# Patient Record
Sex: Male | Born: 1955 | Race: White | Hispanic: No | Marital: Married | Smoking: Current every day smoker
Health system: Southern US, Community
[De-identification: ages and names within clinical notes are randomized; demographics above are authoritative.]

## PROBLEM LIST (undated history)

## (undated) DIAGNOSIS — M1990 Unspecified osteoarthritis, unspecified site: Secondary | ICD-10-CM

## (undated) DIAGNOSIS — E785 Hyperlipidemia, unspecified: Secondary | ICD-10-CM

## (undated) DIAGNOSIS — I10 Essential (primary) hypertension: Secondary | ICD-10-CM

---

## 2007-12-25 ENCOUNTER — Ambulatory Visit: Payer: Self-pay | Admitting: *Deleted

## 2007-12-25 ENCOUNTER — Inpatient Hospital Stay (HOSPITAL_COMMUNITY): Admission: AD | Admit: 2007-12-25 | Discharge: 2007-12-29 | Payer: Self-pay | Admitting: Internal Medicine

## 2008-01-11 ENCOUNTER — Ambulatory Visit: Payer: Self-pay | Admitting: Cardiology

## 2008-02-24 ENCOUNTER — Ambulatory Visit: Payer: Self-pay | Admitting: Cardiology

## 2008-02-24 LAB — CONVERTED CEMR LAB
ALT: 33 units/L (ref 0–53)
AST: 18 units/L (ref 0–37)
Alkaline Phosphatase: 56 units/L (ref 39–117)
HDL: 39.9 mg/dL (ref >39.0)
Total Bilirubin: 1.2 mg/dL (ref 0.3–1.2)
Total CHOL/HDL Ratio: 3.1
Total CK: 71 units/L (ref 7–195)
Triglycerides: 129 mg/dL (ref 0–149)

## 2008-02-28 ENCOUNTER — Ambulatory Visit: Payer: Self-pay

## 2008-03-23 ENCOUNTER — Ambulatory Visit: Payer: Self-pay | Admitting: Cardiology

## 2008-07-19 ENCOUNTER — Ambulatory Visit: Payer: Self-pay | Admitting: Cardiology

## 2008-10-19 DIAGNOSIS — E785 Hyperlipidemia, unspecified: Secondary | ICD-10-CM

## 2008-10-19 DIAGNOSIS — K219 Gastro-esophageal reflux disease without esophagitis: Secondary | ICD-10-CM

## 2008-10-19 DIAGNOSIS — M069 Rheumatoid arthritis, unspecified: Secondary | ICD-10-CM | POA: Insufficient documentation

## 2008-10-19 DIAGNOSIS — I2510 Atherosclerotic heart disease of native coronary artery without angina pectoris: Secondary | ICD-10-CM | POA: Insufficient documentation

## 2008-10-20 ENCOUNTER — Ambulatory Visit: Payer: Self-pay | Admitting: Cardiology

## 2008-10-23 ENCOUNTER — Telehealth (INDEPENDENT_AMBULATORY_CARE_PROVIDER_SITE_OTHER): Payer: Self-pay | Admitting: *Deleted

## 2008-11-01 ENCOUNTER — Telehealth: Payer: Self-pay | Admitting: Cardiology

## 2008-11-28 ENCOUNTER — Ambulatory Visit: Payer: Self-pay | Admitting: Cardiology

## 2008-12-20 ENCOUNTER — Telehealth: Payer: Self-pay | Admitting: Cardiology

## 2008-12-20 LAB — CONVERTED CEMR LAB
Alkaline Phosphatase: 56 units/L (ref 39–117)
Bilirubin, Direct: 0 mg/dL (ref 0.0–0.3)
HDL: 45.8 mg/dL (ref >39.00)
Total Bilirubin: 1 mg/dL (ref 0.3–1.2)
Total Protein: 7.1 g/dL (ref 6.0–8.3)
VLDL: 41.2 mg/dL — ABNORMAL HIGH (ref 0.0–40.0)

## 2009-03-16 ENCOUNTER — Ambulatory Visit: Payer: Self-pay | Admitting: Cardiology

## 2009-03-16 DIAGNOSIS — F17200 Nicotine dependence, unspecified, uncomplicated: Secondary | ICD-10-CM | POA: Insufficient documentation

## 2009-03-26 LAB — CONVERTED CEMR LAB
ALT: 22 units/L (ref 0–53)
AST: 15 units/L (ref 0–37)
Alkaline Phosphatase: 52 units/L (ref 39–117)
Bilirubin, Direct: 0 mg/dL (ref 0.0–0.3)
LDL Cholesterol: 35 mg/dL (ref 0–99)
Total Bilirubin: 1 mg/dL (ref 0.3–1.2)
Total CHOL/HDL Ratio: 3
Total Protein: 7 g/dL (ref 6.0–8.3)

## 2009-05-10 ENCOUNTER — Encounter: Payer: Self-pay | Admitting: Cardiology

## 2009-08-16 ENCOUNTER — Telehealth: Payer: Self-pay | Admitting: Cardiology

## 2010-01-04 ENCOUNTER — Telehealth: Payer: Self-pay | Admitting: Cardiology

## 2010-01-14 ENCOUNTER — Telehealth: Payer: Self-pay | Admitting: Cardiology

## 2010-01-18 ENCOUNTER — Telehealth: Payer: Self-pay | Admitting: Cardiology

## 2010-02-20 ENCOUNTER — Telehealth: Payer: Self-pay | Admitting: Cardiology

## 2010-05-03 ENCOUNTER — Encounter: Payer: Self-pay | Admitting: Cardiology

## 2010-05-03 ENCOUNTER — Ambulatory Visit: Payer: Self-pay | Admitting: Cardiology

## 2010-05-05 LAB — CONVERTED CEMR LAB
ALT: 22 units/L (ref 0–53)
AST: 19 units/L (ref 0–37)
Albumin: 3.9 g/dL (ref 3.5–5.2)
Cholesterol: 106 mg/dL (ref 0–200)
HDL: 31.4 mg/dL — ABNORMAL LOW (ref >39.00)
Total Bilirubin: 1.9 mg/dL — ABNORMAL HIGH (ref 0.3–1.2)
Total Protein: 7 g/dL (ref 6.0–8.3)
Triglycerides: 158 mg/dL — ABNORMAL HIGH (ref 0.0–149.0)
VLDL: 31.6 mg/dL (ref 0.0–40.0)

## 2010-05-06 ENCOUNTER — Encounter: Payer: Self-pay | Admitting: Cardiology

## 2010-06-18 NOTE — Progress Notes (Signed)
Summary: Protonix  Phone Note Call from Patient Call back at Home Phone [phone]   Caller: Spouse/Cynthia Pruett Reason for Call: Talk to Nurse, Talk to Doctor Summary of Call: per spouse insurance will not pay for protonix and she needs a generic Initial call taken by: Omer Jack,  August 16, 2009 3:28 PM  Follow-up for Phone Call        I made the pt's wife aware that Pantoprazole is the generic for Protonix.  The pt's wife said the pt is having reflux even while taking this medication.  The pt's wife is wondering if he can take this medication twice a day.  I will speak with Dr Riley Kill about increasing the pt's Pantoprazole to two times a day.  I made the pt's wife aware that smoking makes reflux worse and recommended smoking cessation.   Follow-up by: Julieta Gutting, RN, BSN,  August 16, 2009 3:55 PM  Additional Follow-up for Phone Call Additional follow up Details #1::        I spoke with Dr Riley Kill about this pt and he said the pt could take Pantoprazole 40mg  two times a day.  Dr Riley Kill would like the pt to see his PCP for further evaluation of his reflux.  The pt can have a new Rx with these instructions but Dr Riley Kill would like further refills to come from the PCP once the pt has been evaluated.   I spoke with the pt's wife and made her aware that the pt can take Pantoprazole two times a day and the pt needs further evaluation of reflux.  At this time the pt does not want to increase Pantoprazole because insurance may not pay for the medication.  The pt also does not want to seek further evaluation for his reflux.  I will send in a Rx for Pantoprazole 40mg  daily.  Additional Follow-up by: Julieta Gutting, RN, BSN,  August 16, 2009 5:21 PM    New/Updated Medications: PANTOPRAZOLE SODIUM 40 MG TBEC (PANTOPRAZOLE SODIUM) take one tablet by mouth daily Prescriptions: PANTOPRAZOLE SODIUM 40 MG TBEC (PANTOPRAZOLE SODIUM) take one tablet by mouth daily  #30 x 3   Entered by:   Julieta Gutting, RN, BSN   Authorized by:   Ronaldo Miyamoto, MD, [facility]   Signed by:   Julieta Gutting, RN, BSN on 08/16/2009   Method used:   Electronically to        [facility] [address]* (retail)       [address]       [place], [place]  [address]       Ph: [phone]       Fax: [phone]   RxID:   [id number]

## 2010-06-18 NOTE — Progress Notes (Signed)
Summary: need extraction of all teeth   Phone Note Call from Patient Call back at Home Phone [phone]   Caller: Patient Reason for Call: Talk to Nurse Summary of Call: per pt calling, pt went to dentist on yesterday all teeth need to be extraction. discuss option hospital or outpatient.  Initial call taken by: Lorne Skeens,  January 04, 2010 2:49 PM  Follow-up for Phone Call        I spoke with the pt's wife and she said the pt was having mouth pain last week and had to go to the ER for treatment.  The ER said the pt needed to have all of his teeth pulled and they started the pt on an antibiotic.  The pt did see the [facility] in [place] and they agreed with the recommendation from the ER and  told him to contact his Cardiologist to get a referral to an oral surgeon. The pt does not have a dentist.   They felt like the pt would need to be hospitalized for dental extractions.  I will ask Dr Riley Kill if he can recommend an oral surgeon.  Julieta Gutting, RN, BSN  January 07, 2010 10:13 AM  Additional Follow-up for Phone Call Additional follow up Details #1::        Pt wife calling back regarding her husband want a call back Judie Grieve  January 10, 2010 4:58 PM  Dr Riley Kill recommends that the pt contact either Dr Thea Gist in [place] [phone])  or Dr Cindra Eves in [place] [phone]).  The pt's wife will contact these oral surgeons for an appointment.  I made her aware that Dr Riley Kill will need to clear the pt prior to dental surgery.   Additional Follow-up by: Julieta Gutting, RN, BSN,  January 11, 2010 9:44 AM

## 2010-06-18 NOTE — Progress Notes (Signed)
Summary: calling re referal  Phone Note Call from Patient   Reason for Call: Talk to Nurse Summary of Call: pt wants to talk to nurse re a referral to the [employer] [facility]-has spoken w.lauren re this [phone] Initial call taken by: Glynda Jaeger,  January 18, 2010 10:49 AM  Follow-up for Phone Call        talked with pt's wife--wife states she contacted the [facility]  [facility] and they  require a treatment plan before they will accept him as a  patient--pt's wife is aware the referral for dental treatment plan should be done by [facility]--wife forwarded to medical records to have medical records sent to [facility]

## 2010-06-18 NOTE — Progress Notes (Signed)
Summary: refill denied   Phone Note Call from Patient   Caller: Patient [phone] Reason for Call: Talk to Nurse Summary of Call: pt calling re protonix-denied refill Initial call taken by: Glynda Jaeger,  February 20, 2010 3:30 PM  Follow-up for Phone Call        I spoke with the pt and made him aware that per phone note from March Dr Riley Kill wanted Protonix refilled by PCP.  The pt said his wife did not let him know this information.  The pt has been out of protonix for a few days and he does not have any pending appts with PCP.  I will send in a 30 day supply with no refills.  Follow-up by: Julieta Gutting, RN, BSN,  February 20, 2010 3:43 PM    New/Updated Medications: PANTOPRAZOLE SODIUM 40 MG TBEC (PANTOPRAZOLE SODIUM) take one tablet by mouth daily Prescriptions: PANTOPRAZOLE SODIUM 40 MG TBEC (PANTOPRAZOLE SODIUM) take one tablet by mouth daily  #30 x 0   Entered by:   Julieta Gutting, RN, BSN   Authorized by:   Ronaldo Miyamoto, MD, [facility]   Signed by:   Julieta Gutting, RN, BSN on 02/20/2010   Method used:   Electronically to        [facility] [address] (retail)       [address]       [place], [place]  [address]       Ph: [phone]       Fax: [phone]   RxID:   [id number]

## 2010-06-18 NOTE — Progress Notes (Signed)
Summary: pt needs new referral  Phone Note Call from Patient Call back at Home Phone [phone]   Caller: Spouse Reason for Call: Talk to Nurse, Talk to Doctor Summary of Call: The dentist you sent the pt to doesn't take their insurance and they need to be refered to [facility] Initial call taken by: Omer Jack,  January 14, 2010 3:18 PM  Follow-up for Phone Call        I spoke with the pt's wife and gave her the contact information for the [facility]. Patient admissions line [phone].  The pt's wife will call tomorrow.  I made her aware that the [facility] in [place] should refer him if the pt cannot do a self-referral. Follow-up by: Julieta Gutting, RN, BSN,  January 14, 2010 5:05 PM

## 2010-06-20 NOTE — Assessment & Plan Note (Signed)
Summary: f1y   Visit Type:  1 year follow up  CC:  No cardiac complaints.  History of Present Illness: Stable at present.  Denies any chest pain.  Overall is doing well.  Gave up smoking about 4 months ago.  Overall he is doing well.  RA is under control.  Discussed DAPT again in detail, he wishes to continue  (has inflammatory disorder, Charisma considered).    Problems Prior to Update: 1)  Tobacco User  (ICD-305.1) 2)  Cad  (ICD-414.00) 3)  Hyperlipidemia  (ICD-272.4) 4)  Gastroesophageal Reflux Disease  (ICD-530.81) 5)  Arthritis, Rheumatoid  (ICD-714.0)  Current Medications (verified): 1)  Plavix 75 Mg Tabs (Clopidogrel Bisulfate) .... Take One Tablet By Mouth Daily 2)  Metoprolol Tartrate 25 Mg Tabs (Metoprolol Tartrate) .... Take 1/2 Tablet Two Times A Day 3)  Methotrexate 2.5 Mg Tabs (Methotrexate Sodium) .... Take 10 Tablets Once A Week 4)  Humira 40 Mg/0.86ml Kit (Adalimumab) .... One Shot Once A Week 5)  Folic Acid 1 Mg Tabs (Folic Acid) .... Take 1 Tablet By Mouth Once A Day 6)  Prednisone 10 Mg Tabs (Prednisone) .... Take 1 Tablet By Mouth Once A Day 7)  Pantoprazole Sodium 40 Mg Tbec (Pantoprazole Sodium) .... Take One Tablet By Mouth Daily 8)  Aspirin 81 Mg Tbec (Aspirin) .... Take One Tablet By Mouth Daily 9)  Nitroglycerin 0.4 Mg Subl (Nitroglycerin) .... One Tablet Under Tongue Every 5 Minutes As Needed For Chest Pain---May Repeat Times Three 10)  Lortab 5-500 Mg Tabs (Hydrocodone-Acetaminophen) .... As Needed 11)  Crestor 20 Mg Tabs (Rosuvastatin Calcium) .... Take One-Half  Tablet By Mouth Daily. 12)  Calcium Carbonate-Vitamin D 600-400 Mg-Unit  Tabs (Calcium Carbonate-Vitamin D) .... Take 1 Tablet By Mouth Once A Day  Allergies (verified): No Known Drug Allergies  Vital Signs:  Patient profile:   55 year old male Height:      69 inches Weight:      203.50 pounds BMI:     30.16 Pulse rate:   62 / minute Pulse rhythm:   regular Resp:     18 per minute BP  sitting:   120 / 78  (left arm) Cuff size:   large  Vitals Entered By: Vikki Ports (May 03, 2010 11:35 AM)  Physical Exam  General:  Well developed, well nourished, in no acute distress. Head:  normocephalic and atraumatic Eyes:  PERRLA/EOM intact; conjunctiva and lids normal. Lungs:  Clear bilaterally to auscultation and percussion. Heart:  PMI non displaced. Normal S1 and S2.  No definite murmur or rub Pulses:  pulses normal in all 4 extremities Extremities:  No clubbing or cyanosis. Neurologic:  Alert and oriented x 3.   EKG  Procedure date:  05/03/2010  Findings:      NSR.  Within normal limits.   Impression & Recommendations:  Problem # 1:  CAD (ICD-414.00) appears stopping.  I offered hiim the option of stopping DAPT, he prefers to continue.  Reasons assessed. His updated medication list for this problem includes:    Plavix 75 Mg Tabs (Clopidogrel bisulfate) .Marland Kitchen... Take one tablet by mouth daily    Metoprolol Tartrate 25 Mg Tabs (Metoprolol tartrate) .Marland Kitchen... Take 1/2 tablet two times a day    Aspirin 81 Mg Tbec (Aspirin) .Marland Kitchen... Take one tablet by mouth daily    Nitroglycerin 0.4 Mg Subl (Nitroglycerin) ..... One tablet under tongue every 5 minutes as needed for chest pain---may repeat times three  Orders: EKG w/ Interpretation (93000)  TLB-Lipid Panel (80061-LIPID) TLB-Hepatic/Liver Function Pnl (80076-HEPATIC)  Problem # 2:  HYPERLIPIDEMIA (ICD-272.4) need to check lipid and liver. Goals expressed. His updated medication list for this problem includes:    Crestor 20 Mg Tabs (Rosuvastatin calcium) .Marland Kitchen... Take one-half  tablet by mouth daily.  Orders: TLB-Lipid Panel (80061-LIPID) TLB-Hepatic/Liver Function Pnl (80076-HEPATIC)  Problem # 3:  TOBACCO USER (ICD-305.1) stopped.  Problem # 4:  ARTHRITIS, RHEUMATOID (ICD-714.0) improved.  Patient Instructions: 1)  Your physician wants you to follow-up in: 1 YEAR.  You will receive a reminder letter in the mail  two months in advance. If you don't receive a letter, please call our office to schedule the follow-up appointment. 2)  Your physician recommends that you continue on your current medications as directed. Please refer to the Current Medication list given to you today. 3)  Your physician recommends that you have a FASTING LIPID and LIVER Profile today.

## 2010-06-20 NOTE — Letter (Signed)
Summary: Custom - Lipid  [facility], Main Office  [address]   [place], [place] [address]   Phone: [phone]  Fax: [phone]     May 06, 2010 MRN: [id number]   [facility] [address] [place], [place]  [address]   Dear Mr. Matassa,  We have reviewed your cholesterol results.  They are as follows:     Total Cholesterol:    106 (Desirable: less than 200)       HDL  Cholesterol:     31.40  (Desirable: greater than 40 for men and 50 for women)       LDL Cholesterol:       43  (Desirable: less than 100 for low risk and less than 70 for moderate to high risk)       Triglycerides:       158.0  (Desirable: less than 150)  Our recommendations include: Let patient know his cholesterol looks great.  He can continue the same treatment.  T[place]   Call our office at the number listed above if you have any questions.  Lowering your LDL cholesterol is important, but it is only one of a large number of "risk factors" that may indicate that you are at risk for heart disease, stroke or other complications of hardening of the arteries.  Other risk factors include:   A.  Cigarette Smoking* B.  High Blood Pressure* C.  Obesity* D.   Low HDL Cholesterol (see yours above)* E.   Diabetes Mellitus (higher risk if your is uncontrolled) F.  Family history of premature heart disease G.  Previous history of stroke or cardiovascular disease    *These are risk factors YOU HAVE CONTROL OVER.  For more information, visit .  There is now evidence that lowering the TOTAL CHOLESTEROL AND LDL CHOLESTEROL can reduce the risk of heart disease.  The American Heart Association recommends the following guidelines for the treatment of elevated cholesterol:  1.  If there is now current heart disease and less than two risk factors, TOTAL CHOLESTEROL should be less than 200 and LDL CHOLESTEROL should be less than 100. 2.  If there is current heart disease or two or more risk  factors, TOTAL CHOLESTEROL should be less than 200 and LDL CHOLESTEROL should be less than 70.  A diet low in cholesterol, saturated fat, and calories is the cornerstone of treatment for elevated cholesterol.  Cessation of smoking and exercise are also important in the management of elevated cholesterol and preventing vascular disease.  Studies have shown that 30 to 60 minutes of physical activity most days can help lower blood pressure, lower cholesterol, and keep your weight at a healthy level.  Drug therapy is used when cholesterol levels do not respond to therapeutic lifestyle changes (smoking cessation, diet, and exercise) and remains unacceptably high.  If medication is started, it is important to have you levels checked periodically to evaluate the need for further treatment options.  Thank you,    [employer] Team

## 2010-08-27 ENCOUNTER — Telehealth: Payer: Self-pay | Admitting: Cardiology

## 2010-08-27 NOTE — Telephone Encounter (Signed)
Patient had everolimus eluting DES placed in August 2009.  He has been stable at the present.  He could hold plavix for five days prior to dental extraction without difficulty.  He should remain on ASA throughout per guidelines.  TS

## 2010-08-28 NOTE — Telephone Encounter (Signed)
Left message for pt to call back  °

## 2010-08-28 NOTE — Telephone Encounter (Signed)
I spoke with the pt's wife and made her aware of Dr Rosalyn Charters recommendation. She would like note faxed to [phone] Attn: [facility], Dr Abby Potash. Pt has a pending appointment on 09/02/10.  This telephone call will be faxed.

## 2010-08-30 ENCOUNTER — Telehealth: Payer: Self-pay | Admitting: Cardiology

## 2010-08-30 NOTE — Telephone Encounter (Signed)
Pt having pain in his teeth. Pt wife wants to know if he can get some meds.

## 2010-08-30 NOTE — Telephone Encounter (Signed)
I spoke with the pt's wife and made her aware that Dr Riley Kill does not prescribe pain medications.  She was also wondering if the pt could get an antibiotic for his tooth pain.  I made her aware that she should contact the dentist who is going to be pulling teeth on Monday for antibiotic if needed.  She said that she had already contacted them and they told her to call the pt's medical doctor to be evaluated if an antibiotic is needed.  Per the pt's wife the pt has not seen PCP in a year.  I instructed her that the pt can take Tylenol for pain.  I made her aware that when the pt goes into dental office on Monday they may give the pt an antibiotic after dental extraction.

## 2010-09-12 ENCOUNTER — Other Ambulatory Visit: Payer: Self-pay | Admitting: Cardiology

## 2010-10-01 NOTE — Discharge Summary (Signed)
Patrick Archer, Patrick Archer NO.:  [id number]   MEDICAL RECORD NO.:  [id number]           PATIENT TYPE:   LOCATION:                               FACILITY:  [facility]   PHYSICIAN:  [name]. Riley Kill, MD, FACCDATE OF BIRTH:  Jul 31, 1955   DATE OF ADMISSION:  12/25/2007  DATE OF DISCHARGE:  12/30/2007                         DISCHARGE SUMMARY - REFERRING   ADMITTING PHYSICIAN:  Bevelyn Buckles. Bensimhon, MD.   DISCHARGING PHYSICIAN:  [name]. Riley Kill, MD, [facility].   DISCHARGE DIAGNOSIS:  1. ST elevated myocardial infarction.  2. Coronary artery disease.  3. Status post drug-eluting stent to the LAD (left anterior      descending).  4. Tobacco use.  5. Hyperlipidemia.  6. History of rheumatoid arthritis.  7. History as listed previously below.   PROCEDURES PERFORMED:  Cardiac catheterization with drug-eluting stent  placement to the proximal LAD by Dr. Bonnee Quin on December 27, 2007.   SUMMARY OF HISTORY:  [name] is a 55 year old white male who was  transferred from [facility] with chest discomfort.  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  Denied shortness of breath.  He took his friend's sublingual  nitroglycerin and two Darvocet without relief thus went to the emergency  room.  At [facility] he had 1 mm ST elevation inferiorly with  reciprocal changes.  By the time he was transferred to [facility] ST-  segment elevation had resolved.  He had been treated with aspirin,  heparin and nitroglycerin drip.   PAST MEDICAL HISTORY:  Is notable for significant rheumatoid arthritis  treated with methotrexate, Humira and prednisone taper as well as a  history of hypertriglyceridemia.  He also continues to smoke.   LABORATORY:  At [facility] admission weight was 93.8 kg.  H&H  on the 9th was 13.0 and 37.9, normal indices, platelets 247,  WBCs 9.5,  PTT 37, PT 13.2.  Sodium 140, potassium 4.2, BUN 10, creatinine 0.82,  glucose 89, normal LFTs except for AST that was slightly elevated at 44.  Initial CK-MB at [facility] was 110 and 8.4 with a relative index of 7.6  and troponin of 1.10.  On the 9th CK total was 309, MB 35.5, relative  index 11.5, troponin 5.78.  Subsequent CK-MBs were declining.  Fasting  lipids on the 9th showed total cholesterol 156, triglycerides 231, HDL  26, LDL 84.  TSH 1.43.  Prior to discharge on the 12th H&H was 13.1 and  37.9, normal indices, platelets 255, WBCs 9.1.   HOSPITAL COURSE:  The patient was admitted to [facility] and  was seen by Dr. Gala Romney.  It was felt that if he had recurring chest  discomfort he should go immediately to the lab.  Dr. Riley Kill reviewed  cardiac catheterization with the patient and agreed to proceed.  Catheterization performed on December 27, 2007, by Dr. Riley Kill revealed  30% proximal and 30% mid RCA lesion, 50% mid circumflex, 70% OM, 75%  with  plaque rupture in the proximal LAD.  Dr. Riley Kill performed IVUS and  utilized a drug-eluting stent to the proximal LAD reducing this to 0%.  Progressive nursing assisted with discharge needs.  Tobacco consultation  was performed.  Cardiac rehab assisted with education and ambulation.  By the 12th he was ambulating without difficulty.  Catheterization site  was intact.  He was complaining of some discomfort to his right shoulder  reminiscent of his rheumatoid arthritis.   DISPOSITION:  The patient was discharged home and asked to maintain low-  sodium heart-healthy diet.  Wound care as per supplemental sheet.  He  was advised no lifting, driving, sexual activity or heavy exertion for  at least 2 weeks.   NEW MEDICATIONS:  1. Include aspirin 325 mg daily.  2. Plavix 75 mg daily.  3. Nitroglycerin 0.4 as needed.  4. Lopressor 25 mg half tablet b.i.d.  5. Zocor 40 mg daily at bedtime.  6. He was asked to continue  his methotrexate 2.5 mg 10 tablets on      Wednesdays.  7. Humira 40 mg shot on Thursdays.  8. Prednisone taper as previously/as needed.  9. Darvocet N 100 q.4-6 hours as needed.   Prior to being discharged his medications were reviewed with the  pharmacy and it was felt that there was no significant reaction.  He  will need blood work in 6-8 weeks in regards to FLP and LFTs.  He needs  to obtain a primary care physician in [place].  He was advised no  smoking or tobacco products and to bring all medications to all followup  appointments.  He will see Dr. Riley Kill in [place] on January 11, 2008,  at 10:15.  He was asked to follow up with his rheumatologist to maintain  routine CBCs given his medications.  Discharge time 50 minutes.      [name], PA-C      [name]. Riley Kill, MD, [facility]  Electronically Signed    EW/MEDQ  D:  12/29/2007  T:  12/29/2007  Job:  [id number]   cc:   Concepcion Living

## 2010-10-01 NOTE — Assessment & Plan Note (Signed)
[facility]                            CARDIOLOGY OFFICE NOTE   Valor, Quaintance HARISH BRAM                    MRN:          [id number]  DATE:03/23/2008                            DOB:          1956/03/04    Mr. Sorber is in for followup.  He is still having a little bit of  chest discomfort, but it is clearly a musculoskeletal type of discomfort  up under the left underarm.  He really wonders whether it could be from  the Zocor.  He decreased his therapy on January 04, 2008, to 20 mg daily,  and he thinks the symptoms are somewhat improved.  As a result, he still  thinks it is musculoskeletal and is pretty convinced that it is not  cardiac.  Moreover, he relates it to the use of the statins.  He did  think that his stress test created a little bit of flare-up of his  arthritis.  The patient exercised for 5 minutes on the Bruce protocol  and did have a hypertensive response to exercise.  He says this is  distinctly unusual and different for what he usually has.  He had normal  contractility with an ejection fraction of 70% and there were no  significant changes of ischemia.  There were some inferior thinning, but  no evidence of perfusion deficit.  Other than his one symptom that has  not been changed.   MEDICATIONS:  1. Plavix 75 mg daily.  2. Lopressor 25 mg one-half tablet b.i.d.  3. Zocor 20.  4. Methotrexate.  5. Humira.  6. Folic acid 1 mg daily.  7. Prednisone 10 daily.  8. Nexium 40 mg daily.  9. Aspirin 325 mg daily.   On physical examination, he is alert and oriented in no acute distress.  Blood pressure 120/80.  The lung fields are clear.  The cardiac rhythm  is regular.  The extremities do not reveal significant edema.   IMPRESSION:  1. Atypical chest pain, etiology undetermined, negative chest x-ray      musculoskeletal type.  2. Status post implantation of drug-eluting stent, left anterior      descending artery on January 01, 2008.  3. Rheumatoid arthritis, followed very nicely by Dr. Concepcion Living      from [facility] up in the [facility].   PLAN:  1. Continue current medical regimen except for discontinuation of      Zocor and Zocor holiday of approximately 6-8 weeks to see if his      symptoms improve.  2. We will see him back in followup at that time and reassess his      overall situation.  At 6      months, we will recommend decreasing his aspirin to 81 mg, as he is      currently on steroid therapy.     [name]. Riley Kill, MD, [facility]  Electronically Signed    TDS/MedQ  DD: 03/24/2008  DT: 03/24/2008  Job #: [id number]

## 2010-10-01 NOTE — Assessment & Plan Note (Signed)
[facility]ALTHCARE                            CARDIOLOGY OFFICE NOTE   Patrick Archer, Patrick Archer                    MRN:          [id number]  DATE:07/19/2008                            DOB:          04-21-1956    Patrick Archer is in for followup.  I last saw him in November.  He has  rheumatoid arthritis followed by Dr. Elijah Birk at [facility], and is status  post implantation of a drug-eluting stent in the LAD in August 2009.  He  still has a slight degree of chest discomfort, but it is decreasing in  frequency.  He has a fair amount of tiredness.  He has remained on a  combination of prednisone and methotrexate, and we have kept him on both  aspirin and Plavix with dual antiplatelet therapy.  Unfortunately, he  does continue to smoke.   His medications include:  1. Plavix 75 mg daily.  2. Lopressor 25 mg one-half daily.  3. Zocor 20 mg daily.  4. Methotrexate 2.5 mg every Wednesday.  5. Humira.  6. Folic acid 1 mg daily.  7. Prednisone 10 mg daily.  8. Nexium 40 mg daily.  9. Aspirin 81 mg daily.   On physical examination, he is alert and oriented in no distress.  The  blood pressure is 128/83, the pulse is 69.  The lung fields are clear.  There is an S4 gallop.   The electrocardiogram demonstrates normal sinus rhythm and is  essentially a normal EKG.   IMPRESSION:  1. Coronary artery disease status post stenting of the left anterior      descending artery with drug-eluting stent on continued dual      antiplatelet therapy and negative myocardial perfusion study on      February 28, 2008.  2. Rheumatoid arthritis.  3. Continued smoking.  4. Gastroesophageal reflux on Nexium, consider switch if he can afford      it.   PLAN:  1. Return to clinic in 6 months.  2. Continue current medical regimen.   ADDENDUM  We advised him to stop smoking.      [name]. Riley Kill, MD, [facility]  Electronically Signed    TDS/MedQ  DD: 07/30/2008  DT: 07/31/2008  Job  #: [id number]   cc:   Concepcion Living

## 2010-10-01 NOTE — Assessment & Plan Note (Signed)
[facility]                            CARDIOLOGY OFFICE NOTE   Patrick Archer, Patrick Archer                    MRN:          [id number]  DATE:02/24/2008                            DOB:          1955/07/25    HISTORY OF PRESENT ILLNESS:  Patrick Archer is in for a followup.  Generally he is stable, however, he has a somewhat constant chest  discomfort that is over in the left axillary area almost in a single  distribution.  It is not the heaviness that he felt previously, or the  central chest discomfort.  He does have some fatigue.  He has gone back  to smoking about a pack of cigarettes a day.  The discomfort is somewhat  constant, not associated with diaphoresis or nausea, and completely  dissimilar to what he has had in the past.   MEDICATIONS:  1. Aspirin 325 mg daily.  2. Nexium 40 mg daily.  3. Prednisone 10 mg daily.  4. Folic acid one daily.  5. Humira 40 mg shot Thursdays.  6. Methotrexate 2.5 Wednesdays.  7. Zocor 40 mg nightly decreased to 20 mg due to muscle aches.  8. Lopressor 25 mg one-half tablet b.i.d.  9. He is also on Plavix 75 mg daily.   PHYSICAL EXAMINATION:  Today he is alert and oriented, in no distress.  Weighs 208 pounds, blood pressure 126/86, pulse 71.  There is minimal if  any tenderness around the left rib margin.  The lung fields are clear.  The cardiac rhythm is regular.   His electrocardiogram is entirely within normal limits.   Recent reading on his chest x-ray revealed submaximal inspiration, but  no active cardiopulmonary disease.   Recent BUN was 9, creatinine 0.7, white count of 65,00, hemoglobin and  hematocrit were normal.  PSA was normal as well.   The symptoms do not seem typical cardiac-type symptoms.  I think it is  reasonable to do a stress test.  He does need to stop smoking and we  talked about this in some detail.  Assuming that his stress test will be  normal, some consideration may need to be given  to doing further studies  either such as a bone scan and/or MRI of the thoracic spine, one  consideration might be a thoracic disk.  There is very little to suggest  aortic dissection.  His blood pressures were  uncontrolled.  This does not sound like pulmonary embolus and certainly  does not sound like ischemia.  We will get a lipid profile today.  We  will add CPK to his lipid profile as well.     Patrick Archer. Riley Kill, MD, [facility]  Electronically Signed    TDS/MedQ  DD: 02/24/2008  DT: 02/25/2008  Job #: [id number]   cc:   Concepcion Living

## 2010-10-01 NOTE — Assessment & Plan Note (Signed)
[facility]                            CARDIOLOGY OFFICE NOTE   Trae, Bovenzi KORIE BRABSON                    MRN:          [id number]  DATE:01/11/2008                            DOB:          May 17, 1956    Mr. Melhorn is in for a followup visit.  He in general, he is stable.  He presented to the emergency room and was transferred to [facility] with acute coronary syndrome.  Enzymes were positive.  He  subsequently underwent cardiac catheterization, which demonstrated a  ruptured plaque in the left anterior descending artery.  This was  successfully stented by IVUS-guided means.  He was subsequently  stabilized on a medical regimen.  He had some other scattered disease as  noted.  He is a smoker, since discharged from the hospital, he has  stopped smoking.  The patient also has rheumatoid arthritis, and has  been followed closely by Dr. Concepcion Living from [facility] at the clinic in  [place].  Since discharged from the hospital, the patient has felt  unusual sensation around the left rib cage, but nothing unusual.  It is  nothing like what he had when he originally presented.  His hemoglobin  at the time of admission was near normal and a platelet count was normal  as well.   He did feel some muscle aches on Zocor and subsequently that dose had  been dropped 20 mg with ultimate resolution in his symptoms.   Medications currently include,  1. Aspirin 325 mg daily.  2. Plavix 75 mg daily.  3. Lopressor 25 mg one-half b.i.d.  4. Zocor 420 mg daily.  5. Methotrexate daily.  6. Humira 40 mg shot on Thursday.  7. Folic acid 1 mg daily.  8. Prednisone 10 mg daily.  9. Nexium 40 mg.   Of note, the patient's wife does say that he experiments with his  prednisone based on how he feels.   On physical today, the blood pressure is 120/70, the pulse is 72.  The  lung fields are clear.  No murmurs, rubs, or gallops noted.  Extremities  are without  edema.   EKG today reveals normal sinus rhythm essentially within normal limits.   At the present time, the patient is stable.  He has started driving even  though he was instructed not to drive for minimum of 2 weeks.  I have  told him to gradually increase his activity at the present time.  We  will need to continue on current medical regimen and I will cut back his  aspirin to 81 mg at approximately 3 months.  If he is doing well, he  will continue on the same regimen.  We have talked about many of the issues in some detail.  I have  encouraged him not to smoke.  Should he have any problems in the  interim, he will call me back.     [name]. Riley Kill, MD, [facility]  Electronically Signed    TDS/MedQ  DD: 01/11/2008  DT: 01/12/2008  Job #: [id number]

## 2010-10-01 NOTE — Cardiovascular Report (Signed)
Patrick Archer, HECKMANN NO.:  [id number]   MEDICAL RECORD NO.:  [id number]          PATIENT TYPE:  INP   LOCATION:  2009                         FACILITY:  [facility]   PHYSICIAN:  [name]. Riley Kill, MD, FACCDATE OF BIRTH:  Jan 08, 1956   DATE OF PROCEDURE:  12/27/2007  DATE OF DISCHARGE:  12/29/2007                            CARDIAC CATHETERIZATION   INDICATIONS:  Patrick Archer is a 55 year old with rheumatoid arthritis who  presented to the emergency room with chest pain and some borderline  electrocardiographic abnormalities.  He was treated appropriately and  aggressively.  He was transferred to [facility] where  he was subsequently pain free.  His enzymes were subsequently positive.  He was stabilized on a medical regimen, and then subsequently brought to  the catheterization laboratory for further evaluation.   PROCEDURES:  1. Left heart catheterization.  2. Selective coronary arteriography.  3. Selective left ventriculography.  4. Intravascular ultrasound.  5. Percutaneous stenting of the left anterior descending artery using      a drug-eluting platform.   DESCRIPTION OF PROCEDURE:  The patient was brought to the  catheterization laboratory, and prepped and draped in the usual fashion.  Through an anterior puncture, the femoral artery was entered, a 6-French  sheath was then placed.  Views of left and right coronary arteries were  obtained in multiple angiographic projections.  Central aortic and left  ventricular pressures were measured with pigtail catheter.  Ventriculography was then performed in the RAO projection.  At this  point, we carefully reviewed the films.  The patient had a wrap-around  LAD with the distal LAD appearing to provide for the distal inferior  wall.  There appeared to be a disrupted plaque in the LAD, particularly  as noted on the LAO cranial views.  There was haziness and hypodensity.  There was bifurcational disease  of the circumflex marginal, and also  some mild irregularity of the right coronary artery, but none of these  were appeared to be high grade.  As a result, we elected to perform  intravascular ultrasound.  The patient had been on heparin and  eptifibatide, and was given a double bolus of eptifibatide.  Appropriate  phenoperidine and aspirin administration were delivered.  Following  this, an intravascular ultrasound device was passed down the left  anterior descending artery.  Views of this were obtained, and this was  subsequently followed by additional intracoronary nitroglycerin.  The  intracoronary nitroglycerin did help define the area of disease somewhat  better.  By intravascular ultrasound, there appeared to be moderate  disease proximal to the diagonal branch, and what appeared to be a large  amount of soft plaque in the LAD with a residual lumen of just over 2 x  2.  As a result, we elected to place a drug-eluting stent with careful  views of locations, a 3-0 x 28 Promus drug-eluting platform was then  placed down the left anterior descending artery and deployed at 13  atmospheres.  A post dilatation was then done with a 3.5 x 20 Voyager [place]  balloon with dilatations up  to 14 atmospheres.  There was a marked  improvement in the appearance of the artery with very slight dimpling at  the distal end of the stent, which appeared to be markedly improved  after intracoronary nitroglycerin was again administered.  The procedure  appeared to be successful.  Following this, all catheters were removed  and the femoral sheath was sewn into place.  There were no major  complications.   HEMODYNAMIC DATA:  Central aortic pressure was 115/65.  Left ventricular  pressure 115/12.  There is no significant gradient on pullback across  the aortic valve.   ANGIOGRAPHIC DATA:  1. The left main was free of critical disease.  2. The left anterior descending artery coursed to the apex.  There was       haziness particularly noted in the LAO cranial view and in the RAO      cranial views in the mid left anterior descending artery.  In the      LAO, the stenosis appeared to be about 75% luminal reduction.      Distal to this location after intracoronary nitroglycerin, there is      about 50% narrowing up to the takeoff of the diagonal.  Following      stenting, this was reduced to 0% with a residual angiographic      excellent appearance.  The distal LAD was without critical      narrowing.  3. The circumflex provides a first marginal and an AV circumflex.      There is possibly up to 70% narrowing in the marginal, although it      appears to be ostial disease involving the bifurcation with perhaps      40-50% narrowing in the AV circumflex itself.  4. The right coronary artery has approximately 30% narrowing at the      proximal mid junction, and about 30% narrowing in the midvessel.  5. Ventriculography in the RAO projection reveals overall preserved      global systolic function with an ejection fraction of probably at      least 50%.  In careful review, there appears to be some      anteroapical hypokinesis that correspond to the angiographic      findings.   Intravascular ultrasound was performed.  The catheter was taken up just  proximal to the diagonal branch.  With an automated pullback, the lumen  appears to be approximately 3 to 3.4 mm with soft plaque noted,  particularly just distal to the septal perforator.  At this location,  the lumen narrows down to approximately 2 x 2.4, and that corresponds  with the haziness noted on angiographic analysis.  More proximally the  vessel opens up and is approximately 4 mm x 4 mm lumen.  No post stent  intravascular ultrasound was performed.   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   DISPOSITION:  The patient does have rheumatoid arthritis.  Aspirin  and  Plavix for a minimum of year will be recommended with discontinuation of  Plavix after 1 year if the patient remains perfectly stable.  This will  be at the discretion of the consulting cardiologist.       [name]. Riley Kill, MD, [facility]  Electronically Signed     TDS/MEDQ  D:  01/01/2008  T:  01/01/2008  Job:  [id number]   cc:   Bevelyn Buckles. Bensimhon, MD  Concepcion Living

## 2010-10-01 NOTE — H&P (Signed)
NAMESESAR, MADEWELL NO.:  [id number]   MEDICAL RECORD NO.:  [id number]          PATIENT TYPE:  INP   LOCATION:  2903                         FACILITY:  [facility]   PHYSICIAN:  Unice Cobble, MD     DATE OF BIRTH:  1956/05/10   DATE OF ADMISSION:  12/25/2007  DATE OF DISCHARGE:                              HISTORY & PHYSICAL   CHIEF COMPLAINT:  Chest pain.   PRIMARY DOCTOR:  Dr. Concepcion Living   HISTORY OF PRESENT ILLNESS:  This is a 55 year old white male with a  history of rheumatoid arthritis and tobacco abuse who presents with  chest pain.  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  He also had diaphoresis and nausea, but no  shortness of breath or presyncope.  He took his friend's sublingual  nitroglycerin and two Darvocet without relief and went to the ER  thereafter.  At [facility] emergency department, the patient had 1 mm of  ST elevation inferiorly without reciprocal changes.  On transfer to  [facility], his ECG showed resolution of the ST elevation and the  patient was chest pain free on arrival.  Up until that time, he had been  treated with aspirin, heparin and a nitroglycerin drip.  He has no  symptoms of congestive heart failure.  His troponin at [facility] is  positive at 0.06 with a negative CK-MB.  His transfer was expedited by a  large amount of ectopy and short runs of VT up to four beats.   PAST MEDICAL HISTORY:  1. Rheumatoid arthritis.  2. Hypertriglyceridemia.   ALLERGIES:  NO KNOWN DRUG ALLERGIES.   MEDICATIONS:  1. Methotrexate 2.5 mg - 10 tablets weekly on Wednesday.  2. Humira 40 mg subcu weekly on Thursday.  3. Prednisone 5-30 mg p.r.n. for rheumatoid arthritis flares.  4. Darvocet N-100 q.4-6 h p.r.n. rheumatoid arthritis pain.   SOCIAL HISTORY:  Lives in [place], [place] with his wife.  He is  disabled from his  rheumatoid arthritis.  He smokes one pack per day and  has done so for 37 years.  No alcohol or drugs.   FAMILY HISTORY:  His mother had rheumatoid arthritis, but there is no  history of heart attack or stroke in the family.   REVIEW OF SYSTEMS:  A complete review of systems was performed and found  to be negative other than mentioned in the HPI.   PHYSICAL EXAMINATION:  VITAL SIGNS:  Afebrile with a pulse of 72, blood  pressure 123/70, respiratory rate is 14, O2 sats are 99% on 2 liters.  GENERAL:  He is in no acute distress and slightly overweight.  HEENT: Shows Viola/AT, PERRLA, EOMI, MMM.  Oropharynx without erythema or  exudates.  NECK:  Supple without lymphadenopathy, thyromegaly, bruits or jugular  venous distention.  HEART:  Regular rate and rhythm with a normal S1-S2.  No murmurs,  gallops or rubs noted.  Normal PMI.  Pulses 2+ and equal bilaterally  without bruits.  LUNGS:  Clear to auscultation bilaterally without wheezes, rhonchi or  rales.  No rashes or lesions.  ABDOMEN:  Soft and nontender with normal bowel sounds and no rebound or  guarding.  Negative hepatosplenomegaly.  EXTREMITIES:  Show no cyanosis, clubbing or edema.  MUSCULOSKELETAL:  Shows no joint deformity effusions or spine or CVA  tenderness.  NEUROLOGICAL:  He is alert and oriented x3 with cranial nerves II-XII  grossly intact.  Strength is 5/5 all extremities and axial groups with  normal sensation throughout.   STUDIES:  EKG the rate is 71 with normal sinus rhythm.  There is no ST  elevation inferiorly.  There are T-wave inversions and one and AVL, but  AVR is upright, and I suspect limb lead reversal.   White count is 11.6 with a hemoglobin of 14.9 and platelet count of 282.  Creatinine is 0.9 with a glucose of 122.  Troponin is 0.06 with a CK-MB  of 1.0.   ASSESSMENT/PLAN:  This is a 55 year old white male with an acute  myocardial infarction.  The initial ST elevation resolved upon arrival  and  his troponins are positive.  It is possible that the ectopy and  ventricular tachycardia he had at the other hospital were related to  reperfusion.  I suspect limb lead reversal is responsible for the  lateral T-wave inversions on the present EKG.  Have discussed the case  with Dr. Riley Kill and will plan on a scheduled invasive management on  Monday unless his chest pain returns or ECG changes worsen.  1. Beta blocker and nitroglycerin for blood pressure control and pain      relief.  2. Aspirin, heparin, and Integrilin for anticoagulation.  3. Statin, lipid panel pending.  4. GI prophylaxis.      Unice Cobble, MD  Electronically Signed     ACJ/MEDQ  D:  12/25/2007  T:  12/26/2007  Job:  [id number]

## 2011-01-10 ENCOUNTER — Telehealth: Payer: Self-pay | Admitting: Cardiology

## 2011-01-10 MED ORDER — NITROGLYCERIN 0.4 MG SL SUBL: 0.4000 mg | SUBLINGUAL_TABLET | SUBLINGUAL | Status: AC | PRN

## 2011-01-10 NOTE — Telephone Encounter (Addendum)
Refill of nitro to [facility] [id number], pt out wife said she called it in 3 weeks ago, I don't see any record on it

## 2011-01-11 ENCOUNTER — Other Ambulatory Visit: Payer: Self-pay | Admitting: Cardiology

## 2011-01-27 ENCOUNTER — Other Ambulatory Visit: Payer: Self-pay | Admitting: Cardiology

## 2011-02-14 LAB — BASIC METABOLIC PANEL
CO2: 26
CO2: 27
Chloride: 105
Chloride: 108
Creatinine, Ser: 0.68
Creatinine, Ser: 0.72
GFR calc Af Amer: >60
GFR calc Af Amer: >60
Potassium: 4.2
Potassium: 4.5

## 2011-02-14 LAB — CBC
HCT: 37.9 — ABNORMAL LOW
HCT: 38.9 — ABNORMAL LOW
Hemoglobin: 13.1
Hemoglobin: 13.1
Hemoglobin: 13.2
MCHC: 33.8
MCHC: 33.8
MCHC: 34.7
MCV: 92
MCV: 93.4
MCV: 93.5
Platelets: 247
RBC: 4.12 — ABNORMAL LOW
RBC: 4.17 — ABNORMAL LOW
RBC: 4.19 — ABNORMAL LOW
RDW: 13.8
WBC: 9.1
WBC: 9.7

## 2011-02-14 LAB — COMPREHENSIVE METABOLIC PANEL
BUN: 10
CO2: 26
Chloride: 107
Glucose, Bld: 89
Potassium: 4.2
Sodium: 140

## 2011-02-14 LAB — LIPID PANEL
LDL Cholesterol: 84
Total CHOL/HDL Ratio: 6
Triglycerides: 231 — ABNORMAL HIGH
VLDL: 46 — ABNORMAL HIGH

## 2011-02-14 LAB — PROTIME-INR
INR: 1
Prothrombin Time: 13.2

## 2011-02-14 LAB — CK TOTAL AND CKMB (NOT AT ARMC)
CK, MB: 35.5 — ABNORMAL HIGH
Relative Index: 10.9 — ABNORMAL HIGH
Total CK: 110

## 2011-02-14 LAB — TSH: TSH: 1.483

## 2011-02-14 LAB — MAGNESIUM: Magnesium: 2.3

## 2011-02-14 LAB — HEPARIN LEVEL (UNFRACTIONATED): Heparin Unfractionated: <0.1 — ABNORMAL LOW

## 2011-02-14 LAB — TROPONIN I: Troponin I: 1.1

## 2011-04-24 ENCOUNTER — Ambulatory Visit (INDEPENDENT_AMBULATORY_CARE_PROVIDER_SITE_OTHER): Payer: Medicare Other | Admitting: Cardiology

## 2011-04-24 ENCOUNTER — Encounter: Payer: Self-pay | Admitting: Cardiology

## 2011-04-24 DIAGNOSIS — I2510 Atherosclerotic heart disease of native coronary artery without angina pectoris: Secondary | ICD-10-CM

## 2011-04-24 DIAGNOSIS — E785 Hyperlipidemia, unspecified: Secondary | ICD-10-CM

## 2011-04-24 DIAGNOSIS — F17200 Nicotine dependence, unspecified, uncomplicated: Secondary | ICD-10-CM

## 2011-04-24 NOTE — Assessment & Plan Note (Signed)
Will need fasting lipid and liver profile.

## 2011-04-24 NOTE — Patient Instructions (Signed)
Your physician has recommended you make the following change in your medication: STOP Plavix  Your physician wants you to follow-up in: 1 YEAR. You will receive a reminder letter in the mail two months in advance. If you don't receive a letter, please call our office to schedule the follow-up appointment.  Your physician recommends that you return for a FASTING lipid and liver profile.

## 2011-04-24 NOTE — Progress Notes (Signed)
   HPI:  Doing well.  His dad died so he started smoking again.  Denies any angina.  He does says he bleeds fairly easily, when he gets bumped.  He has had no major falls.  Cath report reviewed in detail.  DES, second generation placed, with appropriate post dilatation.    Current Outpatient Prescriptions  Medication Sig Dispense Refill  . adalimumab (HUMIRA) 40 MG/0.8ML injection Inject 40 mg into the skin once a week.        Marland Kitchen aspirin 81 MG tablet Take 81 mg by mouth daily.        . clopidogrel (PLAVIX) 75 MG tablet TAKE ONE (1) TABLET BY MOUTH DAILY  30 tablet  11  . CRESTOR 20 MG tablet TAKE ONE (1) TABLET BY MOUTH DAILY  30 each  6  . HYDROcodone-acetaminophen (LORTAB 10) 10-500 MG per tablet Take 1 tablet by mouth every 6 (six) hours as needed.        . methotrexate (RHEUMATREX) 2.5 MG tablet Take 2.5 mg by mouth once a week. 10 tablets weekly      Caution:Chemotherapy. Protect from light.       . metoprolol tartrate (LOPRESSOR) 25 MG tablet TAKE 1/2 TABLET BY MOUTH TWICE DAILY.  90 tablet  1  . nitroGLYCERIN (NITROSTAT) 0.4 MG SL tablet Place 1 tablet (0.4 mg total) under the tongue every 5 (five) minutes as needed.  25 tablet  12  . pantoprazole (PROTONIX) 40 MG tablet Take 40 mg by mouth daily.          No Known Allergies  No past medical history on file.  No past surgical history on file.  No family history on file.  History   Social History  . Marital Status: Married    Spouse Name: N/A    Number of Children: N/A  . Years of Education: N/A   Occupational History  . Not on file.   Social History Main Topics  . Smoking status: Current Everyday Smoker  . Smokeless tobacco: Not on file  . Alcohol Use: Not on file  . Drug Use: Not on file  . Sexually Active: Not on file   Other Topics Concern  . Not on file   Social History Narrative  . No narrative on file    ROS: Please see the HPI.  All other systems reviewed and negative.  PHYSICAL EXAM:  BP 138/80   Pulse 65  Ht 5\' 8"  (1.727 m)  Wt 94.257 kg (207 lb 12.8 oz)  BMI 31.60 kg/m2  General: Well developed, well nourished, in no acute distress. Head:  Normocephalic and atraumatic. Neck: no JVD Lungs: Clear to auscultation and percussion. Heart: Normal S1 and S2.  No murmur, rubs or gallops.  Abdomen:  Normal bowel sounds; soft; non tender; no organomegaly Pulses: Pulses normal in all 4 extremities. Extremities: No clubbing or cyanosis. No edema. Neurologic: Alert and oriented x 3.  EKG:  NSR.  WNL.   ASSESSMENT AND PLAN:

## 2011-04-24 NOTE — Assessment & Plan Note (Signed)
We reviewed in detail.  He is now out more than three years from placement of second generation DES.  He understands the role of plavix, and we discussed the balance between thrombosis and bleeding.  He is well beyond guidelines.  Will stop plavix.  At the same time, I strongly encouraged him to try to stop smoking.

## 2011-04-24 NOTE — Assessment & Plan Note (Signed)
Discussed at length again with patient.

## 2011-04-25 ENCOUNTER — Ambulatory Visit (INDEPENDENT_AMBULATORY_CARE_PROVIDER_SITE_OTHER): Payer: Medicare Other | Admitting: *Deleted

## 2011-04-25 DIAGNOSIS — E785 Hyperlipidemia, unspecified: Secondary | ICD-10-CM

## 2011-04-25 LAB — LIPID PANEL
Cholesterol: 118 mg/dL (ref 0–200)
LDL Cholesterol: 40 mg/dL (ref 0–99)
Total CHOL/HDL Ratio: 3
Triglycerides: 177 mg/dL — ABNORMAL HIGH (ref 0.0–149.0)

## 2011-04-25 LAB — HEPATIC FUNCTION PANEL
ALT: 20 U/L (ref 0–53)
AST: 17 U/L (ref 0–37)
Bilirubin, Direct: 0 mg/dL (ref 0.0–0.3)
Total Bilirubin: 0.9 mg/dL (ref 0.3–1.2)
Total Protein: 7.4 g/dL (ref 6.0–8.3)

## 2011-05-05 ENCOUNTER — Other Ambulatory Visit: Payer: Medicare Other | Admitting: *Deleted

## 2011-07-28 ENCOUNTER — Telehealth: Payer: Self-pay | Admitting: Cardiology

## 2011-07-28 NOTE — Telephone Encounter (Signed)
New msg Pt wants to talk to you about his visits and medicaid not covering please call

## 2011-07-28 NOTE — Telephone Encounter (Signed)
I spoke with the pt's wife and she said the pt has [place] and that they received a bill from our office that this insurance was no longer accepted.  The pt's wife is actually in the process of contacting billing.  I made her aware that they should be able to answer her questions.

## 2011-09-10 ENCOUNTER — Other Ambulatory Visit: Payer: Self-pay | Admitting: Cardiology

## 2011-12-05 ENCOUNTER — Other Ambulatory Visit: Payer: Self-pay | Admitting: Cardiology

## 2012-01-29 ENCOUNTER — Other Ambulatory Visit: Payer: Self-pay | Admitting: Cardiology

## 2012-05-06 ENCOUNTER — Encounter: Payer: Self-pay | Admitting: Cardiology

## 2012-05-06 ENCOUNTER — Ambulatory Visit (INDEPENDENT_AMBULATORY_CARE_PROVIDER_SITE_OTHER): Payer: Medicare Other | Admitting: Cardiology

## 2012-05-06 VITALS — BP 137/86 | HR 64 | Ht 68.0 in | Wt 194.8 lb

## 2012-05-06 DIAGNOSIS — M069 Rheumatoid arthritis, unspecified: Secondary | ICD-10-CM

## 2012-05-06 DIAGNOSIS — I2510 Atherosclerotic heart disease of native coronary artery without angina pectoris: Secondary | ICD-10-CM

## 2012-05-06 DIAGNOSIS — F17200 Nicotine dependence, unspecified, uncomplicated: Secondary | ICD-10-CM

## 2012-05-06 DIAGNOSIS — E785 Hyperlipidemia, unspecified: Secondary | ICD-10-CM

## 2012-05-06 LAB — HEPATIC FUNCTION PANEL
ALT: 31 U/L (ref 0–53)
AST: 21 U/L (ref 0–37)
Albumin: 4 g/dL (ref 3.5–5.2)

## 2012-05-06 LAB — LIPID PANEL
HDL: 45 mg/dL (ref >39.00)
Triglycerides: 150 mg/dL — ABNORMAL HIGH (ref 0.0–149.0)
VLDL: 30 mg/dL (ref 0.0–40.0)

## 2012-05-06 NOTE — Assessment & Plan Note (Signed)
He has continued.

## 2012-05-06 NOTE — Patient Instructions (Addendum)
Your physician wants you to follow-up in: ONE YEAR WITH DR Clifton James You will receive a reminder letter in the mail two months in advance. If you don't receive a letter, please call our office to schedule the follow-up appointment.   Your physician recommends that you HAVE LAB WORK TODAY

## 2012-05-06 NOTE — Progress Notes (Signed)
   HPI:  Patient is in for followup. He really is doing pretty well. Unfortunately is not able to cut down on his smoking. He restarted about a year ago. Since I last saw him as was admitted to the hospital at [facility] with diverticulitis. He is currently not on Plavix. He denies any chest pain or discomfort that he had previously is no longer present.  Current Outpatient Prescriptions  Medication Sig Dispense Refill  . adalimumab (HUMIRA) 40 MG/0.8ML injection Inject 40 mg into the skin once a week.        Marland Kitchen aspirin 81 MG tablet Take 81 mg by mouth daily.        . CRESTOR 20 MG tablet TAKE ONE (1) TABLET BY MOUTH DAILY  30 each  5  . HYDROcodone-acetaminophen (LORTAB 10) 10-500 MG per tablet Take 1 tablet by mouth every 6 (six) hours as needed.        . methotrexate (RHEUMATREX) 2.5 MG tablet Take 2.5 mg by mouth once a week. 10 tablets weekly      Caution:Chemotherapy. Protect from light.       . metoprolol tartrate (LOPRESSOR) 25 MG tablet TAKE 1/2 TABLET BY MOUTH TWICE DAILY.  90 tablet  3  . nitroGLYCERIN (NITROSTAT) 0.4 MG SL tablet Place 1 tablet (0.4 mg total) under the tongue every 5 (five) minutes as needed.  25 tablet  12  . pantoprazole (PROTONIX) 40 MG tablet Take 40 mg by mouth daily.          No Known Allergies  No past medical history on file.  No past surgical history on file.  No family history on file.  History   Social History  . Marital Status: Married    Spouse Name: N/A    Number of Children: N/A  . Years of Education: N/A   Occupational History  . Not on file.   Social History Main Topics  . Smoking status: Current Every Day Smoker  . Smokeless tobacco: Not on file  . Alcohol Use: Not on file  . Drug Use: Not on file  . Sexually Active: Not on file   Other Topics Concern  . Not on file   Social History Narrative  . No narrative on file    ROS: Please see the HPI.  All other systems reviewed and negative.  PHYSICAL EXAM:  BP 137/86  Pulse  64  Ht 5\' 8"  (1.727 m)  Wt 194 lb 12.8 oz (88.361 kg)  BMI 29.62 kg/m2  General: Well developed, well nourished, in no acute distress. Head:  Normocephalic and atraumatic. Neck: no JVD Lungs: Clear to auscultation and percussion. Heart: Normal S1 and S2.  No murmur, rubs or gallops.  Abdomen:  Normal bowel sounds; soft; non tender; no organomegaly Pulses: Pulses normal in all 4 extremities. Extremities: No clubbing or cyanosis. No edema. Neurologic: Alert and oriented x 3.  EKG:  NSR.  WNL.  Inferior S waves.  Old tracings reviewed.    ASSESSMENT AND PLAN:

## 2012-05-20 NOTE — Assessment & Plan Note (Signed)
Continuing care in [place] with the [facility] subspecialty team.

## 2012-05-20 NOTE — Assessment & Plan Note (Signed)
Currently without symptoms but does understand risks of continued tobacco use.

## 2012-05-20 NOTE — Assessment & Plan Note (Signed)
Check lipid and liver profile.   

## 2018-02-06 ENCOUNTER — Other Ambulatory Visit: Payer: Self-pay

## 2018-02-06 ENCOUNTER — Encounter (HOSPITAL_COMMUNITY): Payer: Self-pay | Admitting: Emergency Medicine

## 2018-02-06 ENCOUNTER — Inpatient Hospital Stay (HOSPITAL_COMMUNITY)
Admission: EM | Admit: 2018-02-06 | Discharge: 2018-02-08 | DRG: 195 | Disposition: A | Discharge status: Home / Self Care | Payer: Medicare Other | Attending: Internal Medicine | Admitting: Internal Medicine

## 2018-02-06 ENCOUNTER — Emergency Department (HOSPITAL_COMMUNITY): Payer: Medicare Other

## 2018-02-06 DIAGNOSIS — E785 Hyperlipidemia, unspecified: Secondary | ICD-10-CM | POA: Diagnosis present

## 2018-02-06 DIAGNOSIS — Z7982 Long term (current) use of aspirin: Secondary | ICD-10-CM | POA: Diagnosis not present

## 2018-02-06 DIAGNOSIS — J189 Pneumonia, unspecified organism: Secondary | ICD-10-CM | POA: Diagnosis not present

## 2018-02-06 DIAGNOSIS — F17200 Nicotine dependence, unspecified, uncomplicated: Secondary | ICD-10-CM | POA: Diagnosis not present

## 2018-02-06 DIAGNOSIS — D899 Disorder involving the immune mechanism, unspecified: Secondary | ICD-10-CM

## 2018-02-06 DIAGNOSIS — Z7952 Long term (current) use of systemic steroids: Secondary | ICD-10-CM

## 2018-02-06 DIAGNOSIS — K219 Gastro-esophageal reflux disease without esophagitis: Secondary | ICD-10-CM | POA: Diagnosis present

## 2018-02-06 DIAGNOSIS — J181 Lobar pneumonia, unspecified organism: Secondary | ICD-10-CM | POA: Diagnosis not present

## 2018-02-06 DIAGNOSIS — F17210 Nicotine dependence, cigarettes, uncomplicated: Secondary | ICD-10-CM | POA: Diagnosis present

## 2018-02-06 DIAGNOSIS — R509 Fever, unspecified: Secondary | ICD-10-CM | POA: Diagnosis present

## 2018-02-06 DIAGNOSIS — I10 Essential (primary) hypertension: Secondary | ICD-10-CM

## 2018-02-06 DIAGNOSIS — D849 Immunodeficiency, unspecified: Secondary | ICD-10-CM | POA: Diagnosis not present

## 2018-02-06 DIAGNOSIS — Z885 Allergy status to narcotic agent status: Secondary | ICD-10-CM | POA: Diagnosis not present

## 2018-02-06 DIAGNOSIS — Z79899 Other long term (current) drug therapy: Secondary | ICD-10-CM | POA: Diagnosis not present

## 2018-02-06 DIAGNOSIS — M069 Rheumatoid arthritis, unspecified: Secondary | ICD-10-CM | POA: Diagnosis present

## 2018-02-06 HISTORY — DX: Hyperlipidemia, unspecified: E78.5

## 2018-02-06 HISTORY — DX: Essential (primary) hypertension: I10

## 2018-02-06 HISTORY — DX: Unspecified osteoarthritis, unspecified site: M19.90

## 2018-02-06 LAB — COMPREHENSIVE METABOLIC PANEL
ALBUMIN: 2.9 g/dL — AB (ref 3.5–5.0)
ALK PHOS: 49 U/L (ref 38–126)
ALT: 21 U/L (ref 0–44)
ANION GAP: 10 (ref 5–15)
AST: 26 U/L (ref 15–41)
BILIRUBIN TOTAL: 1 mg/dL (ref 0.3–1.2)
BUN: 16 mg/dL (ref 8–23)
CHLORIDE: 101 mmol/L (ref 98–111)
CO2: 21 mmol/L — AB (ref 22–32)
Calcium: 8.2 mg/dL — ABNORMAL LOW (ref 8.9–10.3)
Creatinine, Ser: 1.11 mg/dL (ref 0.61–1.24)
GLUCOSE: 100 mg/dL — AB (ref 70–99)
POTASSIUM: 3.2 mmol/L — AB (ref 3.5–5.1)
Sodium: 132 mmol/L — ABNORMAL LOW (ref 135–145)
Total Protein: 6.7 g/dL (ref 6.5–8.1)

## 2018-02-06 LAB — URINALYSIS, ROUTINE W REFLEX MICROSCOPIC
BACTERIA UA: NONE SEEN
BILIRUBIN URINE: NEGATIVE
Glucose, UA: NEGATIVE mg/dL
KETONES UR: 20 mg/dL — AB
LEUKOCYTES UA: NEGATIVE
Nitrite: NEGATIVE
PH: 5 (ref 5.0–8.0)
Protein, ur: NEGATIVE mg/dL
Specific Gravity, Urine: 1.012 (ref 1.005–1.030)

## 2018-02-06 LAB — CBC
HEMATOCRIT: 38.7 % — AB (ref 39.0–52.0)
Hemoglobin: 13.1 g/dL (ref 13.0–17.0)
MCH: 31.1 pg (ref 26.0–34.0)
MCHC: 33.9 g/dL (ref 30.0–36.0)
MCV: 91.9 fL (ref 78.0–100.0)
Platelets: 169 10*3/uL (ref 150–400)
RBC: 4.21 MIL/uL — ABNORMAL LOW (ref 4.22–5.81)
RDW: 12.8 % (ref 11.5–15.5)
WBC: 6.8 10*3/uL (ref 4.0–10.5)

## 2018-02-06 LAB — I-STAT TROPONIN, ED: Troponin i, poc: 0 ng/mL (ref 0.00–0.08)

## 2018-02-06 LAB — I-STAT CG4 LACTIC ACID, ED: LACTIC ACID, VENOUS: 1 mmol/L (ref 0.5–1.9)

## 2018-02-06 MED ORDER — SODIUM CHLORIDE 0.9 % IV BOLUS
1000.0000 mL | Freq: Once | INTRAVENOUS | Status: AC
  Administered 2018-02-06: 1000 mL via INTRAVENOUS

## 2018-02-06 MED ORDER — SODIUM CHLORIDE 0.9 % IV SOLN
2.0000 g | Freq: Once | INTRAVENOUS | Status: AC
  Administered 2018-02-06: 2 g via INTRAVENOUS
  Filled 2018-02-06: qty 2

## 2018-02-06 MED ORDER — VANCOMYCIN HCL 10 G IV SOLR
1500.0000 mg | Freq: Once | INTRAVENOUS | Status: AC
  Administered 2018-02-06: 1500 mg via INTRAVENOUS
  Filled 2018-02-06 (×2): qty 1500

## 2018-02-06 NOTE — ED Provider Notes (Signed)
[facility] Emergency Department Provider Note MRN:  [id number]  Arrival date & time: 02/06/18     Chief Complaint   Fever   History of Present Illness   Patrick Archer is a 62 y.o. year-old male with a history of rheumatoid arthritis, hypertension presenting to the ED with chief complaint of fever.  Symptoms began 6 days ago.  Experienced 2 days of intermittent fever and shaking chills.  Symptoms lessened for a few days but then returned.  Presented to [facility] emergency department for an infectious work-up, there was some evidence to suggest pneumonia.  He was given IV Rocephin, discharged on doxycycline.  Has taken the doxycycline twice a day for the past 3 days, with no improvement.  Continued high fevers at home, up to 103 this evening.  Patient denies headache or vision change, no neck pain, no chest pain or shortness of breath, no cough, no abdominal pain.  Some burning with urination recently.  Patient did find a tick on his abdomen, which was attached, 3 weeks ago, but was not engorged.  No rash occurred.  Review of Systems  A complete 10 system review of systems was obtained and all systems are negative except as noted in the HPI and PMH.   Patient's Health History    Past Medical History:  Diagnosis Date  . Arthritis   . Hyperlipemia   . Hypertension     History reviewed. No pertinent surgical history.  History reviewed. No pertinent family history.  Social History   Socioeconomic History  . Marital status: Married    Spouse name: Not on file  . Number of children: Not on file  . Years of education: Not on file  . Highest education level: Not on file  Occupational History  . Not on file  Social Needs  . Financial resource strain: Not on file  . Food insecurity:    Worry: Not on file    Inability: Not on file  . Transportation needs:    Medical: Not on file    Non-medical: Not on file  Tobacco Use  . Smoking status: Current Every Day Smoker   Packs/day: 1.00    Types: Cigarettes  . Smokeless tobacco: Never Used  Substance and Sexual Activity  . Alcohol use: Not Currently  . Drug use: Not Currently  . Sexual activity: Not on file  Lifestyle  . Physical activity:    Days per week: Not on file    Minutes per session: Not on file  . Stress: Not on file  Relationships  . Social connections:    Talks on phone: Not on file    Gets together: Not on file    Attends religious service: Not on file    Active member of club or organization: Not on file    Attends meetings of clubs or organizations: Not on file    Relationship status: Not on file  . Intimate partner violence:    Fear of current or ex partner: Not on file    Emotionally abused: Not on file    Physically abused: Not on file    Forced sexual activity: Not on file  Other Topics Concern  . Not on file  Social History Narrative  . Not on file     Physical Exam  Vital Signs and Nursing Notes reviewed Vitals:   02/06/18 2054  BP: 95/68  Pulse: 86  Resp: 16  Temp: 97.8 F (36.6 C)  SpO2: 97%  CONSTITUTIONAL: Well-appearing, NAD NEURO:  Alert and oriented x 3, no focal deficits EYES:  eyes equal and reactive ENT/NECK:  no LAD, no JVD CARDIO: Regular rate, well-perfused, normal S1 and S2 PULM:  CTAB no wheezing or rhonchi GI/GU:  normal bowel sounds, non-distended, non-tender MSK/SPINE:  No gross deformities, no edema SKIN:  no rash, atraumatic PSYCH:  Appropriate speech and behavior  Diagnostic and Interventional Summary    EKG Interpretation  Date/Time:    Ventricular Rate:    PR Interval:    QRS Duration:   QT Interval:    QTC Calculation:   R Axis:     Text Interpretation:        Labs Reviewed  CBC - Abnormal; Notable for the following components:      Result Value   RBC 4.21 (*)    HCT 38.7 (*)    All other components within normal limits  COMPREHENSIVE METABOLIC PANEL - Abnormal; Notable for the following components:   Sodium 132  (*)    Potassium 3.2 (*)    CO2 21 (*)    Glucose, Bld 100 (*)    Calcium 8.2 (*)    Albumin 2.9 (*)    All other components within normal limits  CULTURE, BLOOD (ROUTINE X 2)  CULTURE, BLOOD (ROUTINE X 2)  URINALYSIS, ROUTINE W REFLEX MICROSCOPIC  I-STAT TROPONIN, ED  I-STAT CG4 LACTIC ACID, ED    DG Chest 2 View  Final Result      Medications  sodium chloride 0.9 % bolus 1,000 mL (1,000 mLs Intravenous New Bag/Given 02/06/18 2133)  vancomycin (VANCOCIN) 1,500 mg in sodium chloride 0.9 % 500 mL IVPB (has no administration in time range)  ceFEPIme (MAXIPIME) 2 g in sodium chloride 0.9 % 100 mL IVPB (2 g Intravenous New Bag/Given 02/06/18 2223)  sodium chloride 0.9 % bolus 1,000 mL (1,000 mLs Intravenous New Bag/Given 02/06/18 2222)     Procedures Critical Care  ED Course and Medical Decision Making  I have reviewed the triage vital signs and the nursing notes.  Pertinent labs & imaging results that were available during my care of the patient were reviewed by me and considered in my medical decision making (see below for details).  Continued fever of unclear source in this 62 year old male history of rheumatoid arthritis chronically on methotrexate.  Fever despite 3 days of antibiotics at home.  No meningismus on exam, no rash, no abdominal tenderness, recent dysuria.  Infectious work-up pending, will obtain blood cultures, will consider admission for possible bacteremia in an immunocompromised patient.  Evolving pneumonia found on chest x-ray.  Soft blood pressure here in the ED, responding to fluids.  Failed outpatient therapy, immunocompromise, admitted to hospitalist service for further care.  Provided with IV Vanco and cefepime here in the ED.  Accepted by Dr. Sharl Ma.  Elmer Sow. Pilar Plate, MD [facility] Emergency Medicine [facility] [email or web address]  Final Clinical Impressions(s) / ED Diagnoses     ICD-10-CM   1. Community acquired pneumonia of left lower  lobe of lung (HCC) J18.1   2. Fever R50.9 DG Chest 2 View    DG Chest 2 View  3. Immunocompromised [facility]) D84.9     ED Discharge Orders    None         Sabas Sous, MD 02/06/18 2228

## 2018-02-06 NOTE — ED Triage Notes (Signed)
Pt c/o fever of 102.5 at home, sinus pressure since Monday, pt was seen at [facility] Thursday where they completed blood work, xray and UA, pt was given Rocephin and fluids and was sent home with doxycyline, pt felt better until tonight

## 2018-02-06 NOTE — H&P (Signed)
TRH H&P    Patient Demographics:    Patrick Archer, is a 62 y.o. male  MRN: [id number]  DOB - 07-02-1955  Admit Date - 02/06/2018  Referring MD/NP/PA: Dr. Sedonia Small  Outpatient Primary MD for the patient is Netta Cedars, MD  Patient coming from: Home  Chief complaint-fever   HPI:    Zaedyn Covin  is a 62 y.o. male, with history of rheumatoid arthritis, hypertension came to ED with complaints of fever.  As per patient symptoms began 6 days ago at that time he had 2 days of fever with shaking chills he went to [facility] emergency department for infectious work-up and was given IV Rocephin for developing pneumonia and discharged on doxycycline.  Patient says that he has been taking doxycycline and felt better this morning but started having fever which went up to 103 this afternoon. He came to ED for further evaluation.  Denies shortness of breath.  No chest pain. No nausea vomiting or diarrhea.  No abdominal pain.  Denies dysuria urgency or frequency of urination. In the ED chest x-ray showed retrocardiac opacity raising concern for evolving pneumonia at the left lung base. Patient started on vancomycin and cefepime for immunocompromised state as patient takes methotrexate and Xeljanz.    Review of systems:    In addition to the HPI above,   All other systems reviewed and are negative.   With Past History of the following :    Past Medical History:  Diagnosis Date  . Arthritis   . Hyperlipemia   . Hypertension       History reviewed. No pertinent surgical history.    Social History:      Social History   Tobacco Use  . Smoking status: Current Every Day Smoker    Packs/day: 1.00    Types: Cigarettes  . Smokeless tobacco: Never Used  Substance Use Topics  . Alcohol use: Not Currently       Family History :   Patient says that he has positive family history for CAD but does not remember  who had CAD in his family.   Home Medications:   Prior to Admission medications   Medication Sig Start Date End Date Taking? Authorizing Provider  aspirin 81 MG tablet Take 81 mg by mouth daily.     Yes [provider]  Cholecalciferol (VITAMIN D) 2000 units tablet Take 1 tablet by mouth daily. 01/11/18  Yes [provider]  doxycycline (MONODOX) 100 MG capsule Take 100 mg by mouth 2 (two) times daily. Starting 02/05/2018 x 7 days. 02/05/18  Yes [provider]  esomeprazole (NEXIUM) 40 MG capsule Take 40 mg by mouth daily. 12/16/17  Yes [provider]  folic acid (FOLVITE) 1 MG tablet Take 1 tablet by mouth daily. 02/05/17  Yes [provider]  HYDROcodone-acetaminophen (NORCO/VICODIN) 5-325 MG tablet Take 1 tablet by mouth every 6 (six) hours as needed for pain. 01/29/18  Yes [provider]  methotrexate (RHEUMATREX) 2.5 MG tablet Take 25 mg by mouth once a week. 10 tablets weekly  Caution:Chemotherapy. Protect from light. Takes on Mondays.   Yes [provider]  metoprolol tartrate (LOPRESSOR) 25 MG tablet TAKE 1/2 TABLET BY MOUTH TWICE DAILY. Patient taking differently: Take 25 mg by mouth 2 (two) times daily.  01/29/12  Yes Hillary Bow, MD  nitroGLYCERIN (NITROSTAT) 0.4 MG SL tablet Place 1 tablet (0.4 mg total) under the tongue every 5 (five) minutes as needed. Patient taking differently: Place 0.4 mg under the tongue every 5 (five) minutes as needed for chest pain.  01/10/11  Yes Hillary Bow, MD  predniSONE (DELTASONE) 1 MG tablet Take with the 5 mg tablet. 01/26/18  Yes [provider]  predniSONE (DELTASONE) 5 MG tablet Take with the 1 mg tablet. 01/26/18  Yes [provider]  simvastatin (ZOCOR) 40 MG tablet TAKE ONE TABLET BY MOUTH ONCE A DAY IN THE EVENING 12/11/17  Yes [provider]  XELJANZ 5 MG TABS Take 1 tablet by mouth 2 (two) times daily. 12/24/17  Yes [provider]      Allergies:     Allergies  Allergen Reactions  . Codeine Rash     Physical Exam:   Vitals  Blood pressure 112/60, pulse 87, temperature 97.9 F (36.6 C), resp. rate 16, height 5' 8"  (1.727 m), weight 90.3 kg, SpO2 97 %.  1.  General: Appears in no acute distress  2. Psychiatric:  Intact judgement and  insight, awake alert, oriented x 3.  3. Neurologic: No focal neurological deficits, all cranial nerves intact.Strength 5/5 all 4 extremities, sensation intact all 4 extremities, plantars down going.  4. Eyes :  anicteric sclerae, moist conjunctivae with no lid lag. PERRLA.  5. ENMT:  Oropharynx clear with moist mucous membranes and good dentition  6. Neck:  supple, no cervical lymphadenopathy appriciated, No thyromegaly  7. Respiratory : Normal respiratory effort, good air movement bilaterally, crackles auscultated at left lung base  8. Cardiovascular : RRR, no gallops, rubs or murmurs, no leg edema  9. Gastrointestinal:  Positive bowel sounds, abdomen soft, non-tender to palpation,no hepatosplenomegaly, no rigidity or guarding       10. Skin:  No cyanosis, normal texture and turgor, no rash, lesions or ulcers  11.Musculoskeletal:  Good muscle tone,  joints appear normal , no effusions,  normal range of motion    Data Review:    CBC Recent Labs  Lab 02/06/18 2128  WBC 6.8  HGB 13.1  HCT 38.7*  PLT 169  MCV 91.9  MCH 31.1  MCHC 33.9  RDW 12.8   ------------------------------------------------------------------------------------------------------------------  Results for orders placed or performed during the hospital encounter of 02/06/18 (from the past 48 hour(s))  Blood culture (routine x 2)     Status: None (Preliminary result)   Collection Time: 02/06/18  9:27 PM  Result Value Ref Range   Specimen Description LEFT ANTECUBITAL    Special Requests      BOTTLES DRAWN AEROBIC AND ANAEROBIC Blood Culture adequate volume Performed at [facility], [address], [place], [place] [address]    Culture PENDING    Report Status PENDING   CBC     Status: Abnormal   Collection Time: 02/06/18  9:28 PM  Result Value Ref Range   WBC 6.8 4.0 - 10.5 K/uL   RBC 4.21 (L) 4.22 - 5.81 MIL/uL   Hemoglobin 13.1 13.0 - 17.0 g/dL   HCT 38.7 (L) 39.0 - 52.0 %   MCV 91.9 78.0 - 100.0 fL   MCH 31.1 26.0 -  34.0 pg   MCHC 33.9 30.0 - 36.0 g/dL   RDW 12.8 11.5 - 15.5 %   Platelets 169 150 - 400 K/uL    Comment: Performed at [facility], [address] [place], [place] [address]  Comprehensive metabolic panel     Status: Abnormal   Collection Time: 02/06/18  9:28 PM  Result Value Ref Range   Sodium 132 (L) 135 - 145 mmol/L   Potassium 3.2 (L) 3.5 - 5.1 mmol/L   Chloride 101 98 - 111 mmol/L   CO2 21 (L) 22 - 32 mmol/L   Glucose, Bld 100 (H) 70 - 99 mg/dL   BUN 16 8 - 23 mg/dL   Creatinine, Ser 1.11 0.61 - 1.24 mg/dL   Calcium 8.2 (L) 8.9 - 10.3 mg/dL   Total Protein 6.7 6.5 - 8.1 g/dL   Albumin 2.9 (L) 3.5 - 5.0 g/dL   AST 26 15 - 41 U/L   ALT 21 0 - 44 U/L   Alkaline Phosphatase 49 38 - 126 U/L   Total Bilirubin 1.0 0.3 - 1.2 mg/dL   GFR calc non Af Amer >60 >60 mL/min   GFR calc Af Amer >60 >60 mL/min    Comment: (NOTE) The eGFR has been calculated using the CKD EPI equation. This calculation has not been validated in all clinical situations. eGFR's persistently <60 mL/min signify possible Chronic Kidney Disease.    Anion gap 10 5 - 15    Comment: Performed at [facility], [address] [place], [place] [address]  Blood culture (routine x 2)     Status: None (Preliminary result)   Collection Time: 02/06/18  9:29 PM  Result Value Ref Range   Specimen Description BLOOD RIGHT FOREARM    Special Requests      BOTTLES DRAWN AEROBIC AND ANAEROBIC Blood Culture adequate volume Performed at [facility], [address] [place], [place] [address]    Culture PENDING    Report Status PENDING   I-stat troponin, ED     Status: None    Collection Time: 02/06/18  9:37 PM  Result Value Ref Range   Troponin i, poc 0.00 0.00 - 0.08 ng/mL   Comment 3            Comment: Due to the release kinetics of cTnI, a negative result within the first hours of the onset of symptoms does not rule out myocardial infarction with certainty. If myocardial infarction is still suspected, repeat the test at appropriate intervals.   I-Stat CG4 Lactic Acid, ED     Status: None   Collection Time: 02/06/18  9:39 PM  Result Value Ref Range   Lactic Acid, Venous 1.00 0.5 - 1.9 mmol/L  Urinalysis, Routine w reflex microscopic     Status: Abnormal   Collection Time: 02/06/18 11:00 PM  Result Value Ref Range   Color, Urine YELLOW YELLOW   APPearance CLEAR CLEAR   Specific Gravity, Urine 1.012 1.005 - 1.030   pH 5.0 5.0 - 8.0   Glucose, UA NEGATIVE NEGATIVE mg/dL   Hgb urine dipstick MODERATE (A) NEGATIVE   Bilirubin Urine NEGATIVE NEGATIVE   Ketones, ur 20 (A) NEGATIVE mg/dL   Protein, ur NEGATIVE NEGATIVE mg/dL   Nitrite NEGATIVE NEGATIVE   Leukocytes, UA NEGATIVE NEGATIVE   RBC / HPF 0-5 0 - 5 RBC/hpf   WBC, UA 0-5 0 - 5 WBC/hpf   Bacteria, UA NONE SEEN NONE SEEN   Mucus PRESENT    Hyaline Casts, UA  PRESENT     Comment: Performed at [facility], [address] [place], [place] [address]    Chemistries  Recent Labs  Lab 02/06/18 2128  NA 132*  K 3.2*  CL 101  CO2 21*  GLUCOSE 100*  BUN 16  CREATININE 1.11  CALCIUM 8.2*  AST 26  ALT 21  ALKPHOS 49  BILITOT 1.0   ------------------------------------------------------------------------------------------------------------------  ------------------------------------------------------------------------------------------------------------------ GFR: Estimated Creatinine Clearance: 74.4 mL/min (by C-G formula based on SCr of 1.11 mg/dL). Liver Function Tests: Recent Labs  Lab 02/06/18 2128  AST 26  ALT 21  ALKPHOS 49  BILITOT 1.0  PROT 6.7  ALBUMIN 2.9*   No results  for input(s): LIPASE, AMYLASE in the last 168 hours. No results for input(s): AMMONIA in the last 168 hours. Coagulation Profile: No results for input(s): INR, PROTIME in the last 168 hours. Cardiac Enzymes: No results for input(s): CKTOTAL, CKMB, CKMBINDEX, TROPONINI in the last 168 hours. BNP (last 3 results) No results for input(s): PROBNP in the last 8760 hours. HbA1C: No results for input(s): HGBA1C in the last 72 hours. CBG: No results for input(s): GLUCAP in the last 168 hours. Lipid Profile: No results for input(s): CHOL, HDL, LDLCALC, TRIG, CHOLHDL, LDLDIRECT in the last 72 hours. Thyroid Function Tests: No results for input(s): TSH, T4TOTAL, FREET4, T3FREE, THYROIDAB in the last 72 hours. Anemia Panel: No results for input(s): VITAMINB12, FOLATE, FERRITIN, TIBC, IRON, RETICCTPCT in the last 72 hours.  --------------------------------------------------------------------------------------------------------------- Urine analysis:    Component Value Date/Time   COLORURINE YELLOW 02/06/2018 2300   APPEARANCEUR CLEAR 02/06/2018 2300   LABSPEC 1.012 02/06/2018 2300   PHURINE 5.0 02/06/2018 2300   GLUCOSEU NEGATIVE 02/06/2018 2300   HGBUR MODERATE (A) 02/06/2018 2300   BILIRUBINUR NEGATIVE 02/06/2018 2300   KETONESUR 20 (A) 02/06/2018 2300   PROTEINUR NEGATIVE 02/06/2018 2300   NITRITE NEGATIVE 02/06/2018 2300   LEUKOCYTESUR NEGATIVE 02/06/2018 2300      Imaging Results:    Dg Chest 2 View  Result Date: 02/06/2018 CLINICAL DATA:  Fever of 102.5 and sinus pressure since Monday. EXAM: CHEST - 2 VIEW COMPARISON:  02/13/2018 FINDINGS: AP upright portable view demonstrates stable cardiomegaly. Nonaneurysmal thoracic aorta. Subtle increase in retrocardiac opacity raises concern for an evolving pneumonia at the left lung base. This is best seen on the frontal projection. IMPRESSION: Slightly more prominent retrocardiac opacity raising concern for an evolving pneumonia at the left  lung base. Electronically Signed   By: Ashley Royalty M.D.   On: 02/06/2018 22:02       Assessment & Plan:    Active Problems:   CAP (community acquired pneumonia)   1. Community-acquired pneumonia-patient has failed outpatient treatment with doxycycline, coming with fever.  Chest x-ray showed early developing pneumonia.  Started on vancomycin and cefepime for immunocompromise status.  Patient is on methotrexate, Xeljanz, prednisone for rheumatoid arthritis.  Follow blood culture results.  If patient clinically improves consider scaling down antibiotics the next 1 to 2 days.  2. Rheumatoid arthritis-continue Morrie Sheldon, prednisone.  Patient takes methotrexate every Monday.  Will hold methotrexate at this time  3. Hypertension-blood pressure is soft, will hold Lopressor at this time.   DVT Prophylaxis-   Lovenox   AM Labs Ordered, also please review Full Orders  Family Communication: Admission, patients condition and plan of care including tests being ordered have been discussed with the patient and his wife at bedside who indicate understanding and agree with the plan and Code Status.  Code Status: Full  code  Admission status: Inpatient  Time spent in minutes : 60 minutes   Oswald Hillock M.D on 02/06/2018 at 11:38 PM  Between 7am to 7pm - Pager - [phone]. After 7pm go to [email or web address] - password [facility]  [facility] - Office  [phone]

## 2018-02-06 NOTE — ED Notes (Signed)
Patient transported to X-ray 

## 2018-02-06 NOTE — ED Notes (Signed)
Pt returned from X Ray.

## 2018-02-06 NOTE — ED Notes (Signed)
Hospital Provider at bedside 

## 2018-02-06 NOTE — ED Notes (Signed)
ED Provider at bedside. 

## 2018-02-07 DIAGNOSIS — M069 Rheumatoid arthritis, unspecified: Secondary | ICD-10-CM | POA: Diagnosis present

## 2018-02-07 MED ORDER — ACETAMINOPHEN 325 MG PO TABS: 650.0000 mg | ORAL_TABLET | Freq: Four times a day (QID) | ORAL | Status: DC | PRN

## 2018-02-07 MED ORDER — GUAIFENESIN ER 600 MG PO TB12
600.0000 mg | ORAL_TABLET | Freq: Two times a day (BID) | ORAL | Status: DC
  Administered 2018-02-07 – 2018-02-08 (×3): 600 mg via ORAL
  Filled 2018-02-07 (×3): qty 1

## 2018-02-07 MED ORDER — HYDROCORTISONE NA SUCCINATE PF 100 MG IJ SOLR
50.0000 mg | Freq: Two times a day (BID) | INTRAMUSCULAR | Status: AC
  Administered 2018-02-07 – 2018-02-08 (×2): 50 mg via INTRAVENOUS
  Filled 2018-02-07 (×2): qty 2

## 2018-02-07 MED ORDER — PREDNISONE 10 MG PO TABS
5.0000 mg | ORAL_TABLET | Freq: Every day | ORAL | Status: DC
  Administered 2018-02-07 – 2018-02-08 (×2): 5 mg via ORAL
  Filled 2018-02-07 (×2): qty 1

## 2018-02-07 MED ORDER — TOFACITINIB CITRATE 5 MG PO TABS
1.0000 | ORAL_TABLET | Freq: Two times a day (BID) | ORAL | Status: DC
  Administered 2018-02-07: 1 via ORAL

## 2018-02-07 MED ORDER — ONDANSETRON HCL 4 MG/2ML IJ SOLN: 4.0000 mg | Freq: Four times a day (QID) | INTRAMUSCULAR | Status: DC | PRN

## 2018-02-07 MED ORDER — SODIUM CHLORIDE 0.9 % IV SOLN
INTRAVENOUS | Status: DC
  Administered 2018-02-07 – 2018-02-08 (×2): via INTRAVENOUS

## 2018-02-07 MED ORDER — VANCOMYCIN HCL IN DEXTROSE 1-5 GM/200ML-% IV SOLN
1000.0000 mg | Freq: Two times a day (BID) | INTRAVENOUS | Status: DC
  Administered 2018-02-07 – 2018-02-08 (×3): 1000 mg via INTRAVENOUS
  Filled 2018-02-07 (×3): qty 200

## 2018-02-07 MED ORDER — METOPROLOL TARTRATE 25 MG PO TABS
12.5000 mg | ORAL_TABLET | Freq: Two times a day (BID) | ORAL | Status: DC
  Administered 2018-02-07 – 2018-02-08 (×3): 12.5 mg via ORAL
  Filled 2018-02-07 (×3): qty 1

## 2018-02-07 MED ORDER — ACETAMINOPHEN 650 MG RE SUPP: 650.0000 mg | Freq: Four times a day (QID) | RECTAL | Status: DC | PRN

## 2018-02-07 MED ORDER — ALBUTEROL SULFATE (2.5 MG/3ML) 0.083% IN NEBU: 2.5000 mg | INHALATION_SOLUTION | RESPIRATORY_TRACT | Status: DC | PRN

## 2018-02-07 MED ORDER — SODIUM CHLORIDE 0.9 % IV SOLN
2.0000 g | Freq: Three times a day (TID) | INTRAVENOUS | Status: DC
  Administered 2018-02-07 – 2018-02-08 (×4): 2 g via INTRAVENOUS
  Filled 2018-02-07 (×7): qty 2

## 2018-02-07 MED ORDER — ASPIRIN EC 81 MG PO TBEC
81.0000 mg | DELAYED_RELEASE_TABLET | Freq: Every day | ORAL | Status: DC
  Administered 2018-02-07 – 2018-02-08 (×2): 81 mg via ORAL
  Filled 2018-02-07 (×2): qty 1

## 2018-02-07 MED ORDER — PREDNISONE 1 MG PO TABS
1.0000 mg | ORAL_TABLET | Freq: Every day | ORAL | Status: DC
  Administered 2018-02-07 – 2018-02-08 (×2): 1 mg via ORAL
  Filled 2018-02-07 (×4): qty 1

## 2018-02-07 MED ORDER — INFLUENZA VAC SPLIT QUAD 0.5 ML IM SUSY
0.5000 mL | PREFILLED_SYRINGE | INTRAMUSCULAR | Status: DC
  Filled 2018-02-07: qty 0.5

## 2018-02-07 MED ORDER — IPRATROPIUM-ALBUTEROL 0.5-2.5 (3) MG/3ML IN SOLN: 3.0000 mL | Freq: Four times a day (QID) | RESPIRATORY_TRACT | Status: DC | PRN

## 2018-02-07 MED ORDER — ALUM & MAG HYDROXIDE-SIMETH 200-200-20 MG/5ML PO SUSP
30.0000 mL | Freq: Four times a day (QID) | ORAL | Status: AC | PRN
  Administered 2018-02-07 (×2): 30 mL via ORAL
  Filled 2018-02-07 (×2): qty 30

## 2018-02-07 MED ORDER — ENOXAPARIN SODIUM 40 MG/0.4ML ~~LOC~~ SOLN
40.0000 mg | SUBCUTANEOUS | Status: DC
  Filled 2018-02-07 (×2): qty 0.4

## 2018-02-07 MED ORDER — PANTOPRAZOLE SODIUM 40 MG PO TBEC
40.0000 mg | DELAYED_RELEASE_TABLET | Freq: Every day | ORAL | Status: DC
  Administered 2018-02-07 – 2018-02-08 (×2): 40 mg via ORAL
  Filled 2018-02-07 (×2): qty 1

## 2018-02-07 MED ORDER — PANTOPRAZOLE SODIUM 40 MG PO TBEC: 40.0000 mg | DELAYED_RELEASE_TABLET | Freq: Every day | ORAL | Status: DC

## 2018-02-07 MED ORDER — HYDRALAZINE HCL 20 MG/ML IJ SOLN: 10.0000 mg | Freq: Four times a day (QID) | INTRAMUSCULAR | Status: DC | PRN

## 2018-02-07 MED ORDER — FOLIC ACID 1 MG PO TABS
1.0000 mg | ORAL_TABLET | Freq: Every day | ORAL | Status: DC
  Administered 2018-02-07 – 2018-02-08 (×2): 1 mg via ORAL
  Filled 2018-02-07 (×2): qty 1

## 2018-02-07 MED ORDER — IPRATROPIUM-ALBUTEROL 0.5-2.5 (3) MG/3ML IN SOLN
3.0000 mL | Freq: Three times a day (TID) | RESPIRATORY_TRACT | Status: DC
  Administered 2018-02-07: 3 mL via RESPIRATORY_TRACT
  Filled 2018-02-07: qty 3

## 2018-02-07 MED ORDER — HYDROCODONE-ACETAMINOPHEN 5-325 MG PO TABS
1.0000 | ORAL_TABLET | Freq: Four times a day (QID) | ORAL | Status: DC | PRN
  Administered 2018-02-07: 1 via ORAL
  Filled 2018-02-07: qty 1

## 2018-02-07 MED ORDER — ONDANSETRON HCL 4 MG PO TABS: 4.0000 mg | ORAL_TABLET | Freq: Four times a day (QID) | ORAL | Status: DC | PRN

## 2018-02-07 NOTE — Progress Notes (Signed)
Pharmacy Antibiotic Note  Patrick Archer is a 62 y.o. male admitted on 02/06/2018 with pneumonia.  Pharmacy has been consulted for vancomycin and cefepime  Dosing.  He was treated at [facility] about 1 week ago for  Pneumonia with  Rocephin and discharged home on oral doxycycline.   Plan: Start cefepime 2g IV q8h Give vancomycin 1.5g IV x1 dose, then start vancomycin 1g IV q12h Goal vancomycin goal trough range: 15-20   mcg/mL Pharmacy will continue to monitor renal function, vancomycin troughs, cultures and patient progress.   Height: 5\' 8"  (172.7 cm) Weight: 199 lb (90.3 kg) IBW/kg (Calculated) : 68.4  Temp (24hrs), Avg:98.6 F (37 C), Min:97.8 F (36.6 C), Max:99.8 F (37.7 C)  Recent Labs  Lab 02/06/18 2128 02/06/18 2139  WBC 6.8  --   CREATININE 1.11  --   LATICACIDVEN  --  1.00    Estimated Creatinine Clearance: 74.4 mL/min (by C-G formula based on SCr of 1.11 mg/dL).    Allergies  Allergen Reactions  . Codeine Rash    Antimicrobials this admission: Cefepime 9/22>>  Vancomycin 9/22 >>    Microbiology results: 9/21 BCx2:  pending    Thank you for allowing pharmacy to participate in  this patient's care.  10/21, Pharm. D. Clinical Pharmacist 02/07/2018 7:48 AM

## 2018-02-07 NOTE — Progress Notes (Signed)
Patient Demographics:    Patrick Archer, is a 62 y.o. male, DOB - 07-20-1955, BOF:[id number]  Admit date - 02/06/2018   Admitting Physician Meredeth Ide, MD  Outpatient Primary MD for the patient is Carlota Raspberry, MD  LOS - 1   Chief Complaint  Patient presents with  . Fever        Subjective:    Patrick Archer today has no fevers, no emesis,  No chest pain,  Cough and congestion persist  Assessment  & Plan :    Principal Problem:   CAP (community acquired pneumonia) Active Problems:   TOBACCO USER   Rheumatoid arthritis (HCC)   Brief  Summary:- 62 year old with past medical history relevant for hypertension.,  Tobacco abuse and rheumatoid arthritis who is chronically on immunosuppression-and presumed immunocompromised admitted on 02/06/2018 with left-sided community-acquired pneumonia. patient was seen at [facility] on 02/04/2018 given IV Rocephin and discharged home on p.o. doxycycline but symptoms worsened   Plan:- 1)Lt Sided CAP--- failed outpatient doxycycline, patient is immunocompromised due to #2 below, started on Vanco and cefepime on 02/06/2018, may de-escalate antibiotics in 1 to 2 days with clinical improvement, give mucolytics and bronchodilators as prescribed, no significant hypoxia at this time, hold XELJANZ 5 MG TABS and methotrexate due to acute infection give stress dose steroids as patient is chronically on prednisone  2)Tobacco Abuse- Smoking cessation counseling for 4 minutes today, consider nicotine patch  3)HTN--stable, restart metoprolol tartrate 12.5 mg twice daily ,  may use IV Hydralazine 10 mg  Every 4 hours Prn for systolic blood pressure over 160 mmhg  4)RA-- PTA patient was on prednisone, methotrexate and Xeljanz, he takes methotrexate q. Monday, hold XELJANZ 5 MG TABS and methotrexate due to acute infection give stress dose steroids as patient is  chronically on prednisone   Disposition/Need for in-Hospital Stay- patient unable to be discharged at this time due to need for IV antibiotics for left-sided pneumonia in an immunocompromised patient who failed oral antibiotic  Code Status : Full Code  Antibiotics Doxycycline 02/04/18 thru 02/06/18 Vanco and Cefepime-started 02/06/18  Disposition Plan  : Home  Consults  :  n/a   DVT Prophylaxis  :  Lovenox   Lab Results  Component Value Date   PLT 169 02/06/2018    Inpatient Medications  Scheduled Meds: . aspirin EC  81 mg Oral Daily  . enoxaparin (LOVENOX) injection  40 mg Subcutaneous Q24H  . folic acid  1 mg Oral Daily  . guaiFENesin  600 mg Oral BID  . [START ON 02/08/2018] Influenza vac split quadrivalent PF  0.5 mL Intramuscular Tomorrow-1000  . pantoprazole  40 mg Oral Daily  . predniSONE  1 mg Oral Q breakfast  . predniSONE  5 mg Oral Q breakfast  . Tofacitinib Citrate  1 tablet Oral BID   Continuous Infusions: . sodium chloride 50 mL/hr at 02/07/18 1347  . ceFEPime (MAXIPIME) IV Stopped (02/07/18 0921)  . vancomycin Stopped (02/07/18 0953)   PRN Meds:.acetaminophen **OR** acetaminophen, albuterol, alum & mag hydroxide-simeth, HYDROcodone-acetaminophen, ipratropium-albuterol, ondansetron **OR** ondansetron (ZOFRAN) IV    Anti-infectives (From admission, onward)   Start     Dose/Rate Route Frequency Ordered Stop   02/07/18 1000  vancomycin (VANCOCIN) IVPB 1000 mg/200  mL premix     1,000 mg 200 mL/hr over 60 Minutes Intravenous Every 12 hours 02/07/18 0745     02/07/18 0800  ceFEPIme (MAXIPIME) 2 g in sodium chloride 0.9 % 100 mL IVPB     2 g 200 mL/hr over 30 Minutes Intravenous Every 8 hours 02/07/18 0744     02/06/18 2215  vancomycin (VANCOCIN) 1,500 mg in sodium chloride 0.9 % 500 mL IVPB     1,500 mg 250 mL/hr over 120 Minutes Intravenous  Once 02/06/18 2208 02/07/18 0124   02/06/18 2215  ceFEPIme (MAXIPIME) 2 g in sodium chloride 0.9 % 100 mL IVPB      2 g 200 mL/hr over 30 Minutes Intravenous  Once 02/06/18 2208 02/06/18 2253        Objective:   Vitals:   02/07/18 0012 02/07/18 0607 02/07/18 1256 02/07/18 1407  BP: 107/62 134/75  116/66  Pulse: 93 94  86  Resp: 16 20  20   Temp: 98.7 F (37.1 C) 99.8 F (37.7 C)  98.7 F (37.1 C)  TempSrc: Oral Oral  Oral  SpO2: 98% 100% 98% 97%  Weight:      Height:        Wt Readings from Last 3 Encounters:  02/06/18 90.3 kg  05/06/12 88.4 kg  04/24/11 94.3 kg     Intake/Output Summary (Last 24 hours) at 02/07/2018 1422 Last data filed at 02/07/2018 1347 Gross per 24 hour  Intake 4029.08 ml  Output -  Net 4029.08 ml     Physical Exam Patient is examined daily including today on 02/07/18 , exams remain the same as of yesterday except that has changed   Gen:- Awake Alert,  In no apparent distress HEENT:- [place].AT, No sclera icterus Neck-Supple Neck,No JVD,.  Lungs-diminished in bases with scattered rhonchi left more than right CV- S1, S2 normal, regular Abd-  +ve B.Sounds, Abd Soft, No tenderness,    Extremity/Skin:- No  edema,   good pulses Psych-affect is appropriate, oriented x3 Neuro-no new focal deficits, no tremors   Data Review:   Micro Results Recent Results (from the past 240 hour(s))  Blood culture (routine x 2)     Status: None (Preliminary result)   Collection Time: 02/06/18  9:27 PM  Result Value Ref Range Status   Specimen Description LEFT ANTECUBITAL  Final   Special Requests   Final    BOTTLES DRAWN AEROBIC AND ANAEROBIC Blood Culture adequate volume   Culture   Final    NO GROWTH < 12 HOURS Performed at [facility], [address] [place], [place]    Report Status PENDING  Incomplete  Blood culture (routine x 2)     Status: None (Preliminary result)   Collection Time: 02/06/18  9:29 PM  Result Value Ref Range Status   Specimen Description BLOOD RIGHT FOREARM  Final   Special Requests   Final    BOTTLES DRAWN AEROBIC AND ANAEROBIC Blood  Culture adequate volume   Culture   Final    NO GROWTH < 12 HOURS Performed at [facility], [address], [place], [place]    Report Status PENDING  Incomplete    Radiology Reports Dg Chest 2 View  Result Date: 02/06/2018 CLINICAL DATA:  Fever of 102.5 and sinus pressure since Monday. EXAM: CHEST - 2 VIEW COMPARISON:  02/13/2018 FINDINGS: AP upright portable view demonstrates stable cardiomegaly. Nonaneurysmal thoracic aorta. Subtle increase in retrocardiac opacity raises concern for an evolving pneumonia at the left  lung base. This is best seen on the frontal projection. IMPRESSION: Slightly more prominent retrocardiac opacity raising concern for an evolving pneumonia at the left lung base. Electronically Signed   By: Tollie Eth M.D.   On: 02/06/2018 22:02     CBC Recent Labs  Lab 02/06/18 2128  WBC 6.8  HGB 13.1  HCT 38.7*  PLT 169  MCV 91.9  MCH 31.1  MCHC 33.9  RDW 12.8    Chemistries  Recent Labs  Lab 02/06/18 2128  NA 132*  K 3.2*  CL 101  CO2 21*  GLUCOSE 100*  BUN 16  CREATININE 1.11  CALCIUM 8.2*  AST 26  ALT 21  ALKPHOS 49  BILITOT 1.0   ------------------------------------------------------------------------------------------------------------------ No results for input(s): CHOL, HDL, LDLCALC, TRIG, CHOLHDL, LDLDIRECT in the last 72 hours.  No results found for: HGBA1C ------------------------------------------------------------------------------------------------------------------ No results for input(s): TSH, T4TOTAL, T3FREE, THYROIDAB in the last 72 hours.  Invalid input(s): FREET3 ------------------------------------------------------------------------------------------------------------------ No results for input(s): VITAMINB12, FOLATE, FERRITIN, TIBC, IRON, RETICCTPCT in the last 72 hours.  Coagulation profile No results for input(s): INR, PROTIME in the last 168 hours.  No results for input(s): DDIMER in the last 72  hours.  Cardiac Enzymes No results for input(s): CKMB, TROPONINI, MYOGLOBIN in the last 168 hours.  Invalid input(s): CK ------------------------------------------------------------------------------------------------------------------ No results found for: BNP   Shon Hale M.D on 02/07/2018 at 2:22 PM  Pager---[phone] Go to [email or web address] - password [email or web address] for contact info  [facility] - Office  [phone]

## 2018-02-08 DIAGNOSIS — I10 Essential (primary) hypertension: Secondary | ICD-10-CM

## 2018-02-08 DIAGNOSIS — D899 Disorder involving the immune mechanism, unspecified: Secondary | ICD-10-CM

## 2018-02-08 DIAGNOSIS — D849 Immunodeficiency, unspecified: Secondary | ICD-10-CM

## 2018-02-08 DIAGNOSIS — K219 Gastro-esophageal reflux disease without esophagitis: Secondary | ICD-10-CM

## 2018-02-08 DIAGNOSIS — F17200 Nicotine dependence, unspecified, uncomplicated: Secondary | ICD-10-CM

## 2018-02-08 DIAGNOSIS — M069 Rheumatoid arthritis, unspecified: Secondary | ICD-10-CM

## 2018-02-08 DIAGNOSIS — J181 Lobar pneumonia, unspecified organism: Secondary | ICD-10-CM

## 2018-02-08 LAB — CBC
HCT: 32.7 % — ABNORMAL LOW (ref 39.0–52.0)
Hemoglobin: 11 g/dL — ABNORMAL LOW (ref 13.0–17.0)
MCH: 31.3 pg (ref 26.0–34.0)
MCHC: 33.6 g/dL (ref 30.0–36.0)
MCV: 93.2 fL (ref 78.0–100.0)
Platelets: 152 10*3/uL (ref 150–400)
RBC: 3.51 MIL/uL — ABNORMAL LOW (ref 4.22–5.81)
RDW: 13.2 % (ref 11.5–15.5)
WBC: 4.4 10*3/uL (ref 4.0–10.5)

## 2018-02-08 LAB — BASIC METABOLIC PANEL
Anion gap: 7 (ref 5–15)
BUN: 10 mg/dL (ref 8–23)
CALCIUM: 8.2 mg/dL — AB (ref 8.9–10.3)
CHLORIDE: 108 mmol/L (ref 98–111)
CO2: 23 mmol/L (ref 22–32)
CREATININE: 0.66 mg/dL (ref 0.61–1.24)
GFR calc non Af Amer: >60 mL/min (ref >60)
Glucose, Bld: 127 mg/dL — ABNORMAL HIGH (ref 70–99)
Potassium: 4.4 mmol/L (ref 3.5–5.1)
Sodium: 138 mmol/L (ref 135–145)

## 2018-02-08 MED ORDER — DM-GUAIFENESIN ER 30-600 MG PO TB12: 1.0000 | ORAL_TABLET | Freq: Two times a day (BID) | ORAL | 0 refills | Status: AC

## 2018-02-08 MED ORDER — IPRATROPIUM-ALBUTEROL 20-100 MCG/ACT IN AERS: 1.0000 | INHALATION_SPRAY | Freq: Four times a day (QID) | RESPIRATORY_TRACT | 0 refills | Status: AC | PRN

## 2018-02-08 MED ORDER — AMOXICILLIN-POT CLAVULANATE 875-125 MG PO TABS: 1.0000 | ORAL_TABLET | Freq: Two times a day (BID) | ORAL | 0 refills | Status: AC

## 2018-02-08 NOTE — Discharge Summary (Signed)
Physician Discharge Summary  Patrick Archer KYH:[id number] DOB: 1955/08/25 DOA: 02/06/2018  PCP: Carlota Raspberry, MD  Admit date: 02/06/2018 Discharge date: 02/08/2018  Time spent: 35 minutes  Recommendations for Outpatient Follow-up:  1. Reassess resolution of patient's symptoms and repeat chest x-ray 8 in 4 weeks to assure resolution of infiltrates. 2. Continue assisting patient in his journey of his smoking cessation. 3. Reassess blood pressure and adjust antihypertensive regimen as needed.   Discharge Diagnoses:  Principal Problem:   CAP (community acquired pneumonia) Active Problems:   TOBACCO USER   Rheumatoid arthritis (HCC)   Immunocompromised (HCC)   Benign essential HTN HLD  Discharge Condition: Stable and improved.  Patient discharged home with instructions to follow-up with PCP in 10 days.  Diet recommendation: Heart healthy diet.  Filed Weights   02/06/18 2054  Weight: 90.3 kg    History of present illness:  As per H&P written by Dr. Sharl Ma on 02/06/2018. 62 y.o. male, with history of rheumatoid arthritis, hypertension came to ED with complaints of fever.  As per patient symptoms began 6 days ago at that time he had 2 days of fever with shaking chills he went to [facility] emergency department for infectious work-up and was given IV Rocephin for developing pneumonia and discharged on doxycycline.  Patient says that he has been taking doxycycline and felt better this morning but started having fever which went up to 103 this afternoon. He came to ED for further evaluation.  Denies shortness of breath.  No chest pain. No nausea vomiting or diarrhea.  No abdominal pain.  Denies dysuria urgency or frequency of urination. In the ED chest x-ray showed retrocardiac opacity raising concern for evolving pneumonia at the left lung base. Patient started on vancomycin and cefepime for immunocompromised state as patient takes methotrexate and Xeljanz.   Hospital Course:  1-left-sided  community-acquired pneumonia -Patient failed outpatient therapy to doxycycline prior to admission. -Currently afebrile, no shortness of breath and good oxygen saturation on room air. -Empirically treated with broad-spectrum antibiotic given underlying immunosuppression for his treatment of rheumatoid arthritis. -At discharge patient will complete therapy with Augmentin. -PRN Combivent inhaler has been prescribed, patient advised to stop smoking and instructed to use Mucinex to assist with mucus expectoration.  2-tobacco abuse -I have discussed tobacco cessation with the patient.  I have counseled the patient regarding the negative impacts of continued tobacco use including but not limited to lung cancer, COPD, and cardiovascular disease.  I have discussed alternatives to tobacco and modalities that may help facilitate tobacco cessation including but not limited to biofeedback, hypnosis, and medications.  Total time spent with tobacco counseling was 5 minutes. -nicotine patch ordered.  3-GERD -continue PPI  4-HTN -Stable and well-controlled. -Continue home antihypertensive regimen -Patient advised to follow heart healthy diet.  5-rheumatoid arthritis -Continue prior to admission chronic dose of prednisone, methotrexate and Xeljanz. -Continue PRN pain medications.  6-HLD -continue statins.  Procedures:  See below for x-ray reports.  Consultations:  None  Discharge Exam: Vitals:   02/08/18 0956 02/08/18 1440  BP: 122/75 136/76  Pulse: 69 69  Resp:  18  Temp:  97.7 F (36.5 C)  SpO2:  98%    General: Afebrile, no chest pain, no nausea, no vomiting, reports no shortness of breath.  Still with mild coughing spells, but otherwise no acute distress.  Speaking in full sentences and asking to go home. Cardiovascular: S1 and S2, no rubs, no gallops, no murmurs appreciated on exam. Respiratory: Scattered rhonchi, no  wheezing, good air movement bilaterally, normal respiratory  effort.  Patient with good O2 sat on room air. Abdomen: Soft, nontender, nondistended, positive bowel sounds, no guarding. Extremities: No edema, no cyanosis or clubbing.  Discharge Instructions   Discharge Instructions    Diet - low sodium heart healthy   Complete by:  As directed    Discharge instructions   Complete by:  As directed    Take medications as prescribed Keep yourself well-hydrated Arrange follow-up with PCP in 10 days. Follow heart healthy diet.     Allergies as of 02/08/2018      Reactions   Codeine Rash      Medication List    STOP taking these medications   doxycycline 100 MG capsule Commonly known as:  MONODOX     TAKE these medications   amoxicillin-clavulanate 875-125 MG tablet Commonly known as:  AUGMENTIN Take 1 tablet by mouth every 12 (twelve) hours for 7 days.   aspirin 81 MG tablet Take 81 mg by mouth daily.   dextromethorphan-guaiFENesin 30-600 MG 12hr tablet Commonly known as:  MUCINEX DM Take 1 tablet by mouth 2 (two) times daily.   esomeprazole 40 MG capsule Commonly known as:  NEXIUM Take 40 mg by mouth daily.   folic acid 1 MG tablet Commonly known as:  FOLVITE Take 1 tablet by mouth daily.   HYDROcodone-acetaminophen 5-325 MG tablet Commonly known as:  NORCO/VICODIN Take 1 tablet by mouth every 6 (six) hours as needed for pain.   Ipratropium-Albuterol 20-100 MCG/ACT Aers respimat Commonly known as:  COMBIVENT Inhale 1 puff into the lungs every 6 (six) hours as needed for wheezing or shortness of breath.   methotrexate 2.5 MG tablet Commonly known as:  RHEUMATREX Take 25 mg by mouth once a week. 10 tablets weekly      Caution:Chemotherapy. Protect from light. Takes on Mondays.   metoprolol tartrate 25 MG tablet Commonly known as:  LOPRESSOR TAKE 1/2 TABLET BY MOUTH TWICE DAILY. What changed:  how much to take   nitroGLYCERIN 0.4 MG SL tablet Commonly known as:  NITROSTAT Place 1 tablet (0.4 mg total) under the tongue  every 5 (five) minutes as needed. What changed:  reasons to take this   predniSONE 5 MG tablet Commonly known as:  DELTASONE Take with the 1 mg tablet.   predniSONE 1 MG tablet Commonly known as:  DELTASONE Take with the 5 mg tablet.   simvastatin 40 MG tablet Commonly known as:  ZOCOR TAKE ONE TABLET BY MOUTH ONCE A DAY IN THE EVENING   Vitamin D 2000 units tablet Take 1 tablet by mouth daily.   XELJANZ 5 MG Tabs Generic drug:  Tofacitinib Citrate Take 1 tablet by mouth 2 (two) times daily.      Allergies  Allergen Reactions  . Codeine Rash   Follow-up Information    Carlota Raspberry, MD. Schedule an appointment as soon as possible for a visit in 10 day(s).   Specialty:  Internal Medicine Contact information: [address] [place] [address] [phone]           The results of significant diagnostics from this hospitalization (including imaging, microbiology, ancillary and laboratory) are listed below for reference.    Significant Diagnostic Studies: Dg Chest 2 View  Result Date: 02/06/2018 CLINICAL DATA:  Fever of 102.5 and sinus pressure since Monday. EXAM: CHEST - 2 VIEW COMPARISON:  02/13/2018 FINDINGS: AP upright portable view demonstrates stable cardiomegaly. Nonaneurysmal thoracic aorta. Subtle increase in retrocardiac  opacity raises concern for an evolving pneumonia at the left lung base. This is best seen on the frontal projection. IMPRESSION: Slightly more prominent retrocardiac opacity raising concern for an evolving pneumonia at the left lung base. Electronically Signed   By: Tollie Eth M.D.   On: 02/06/2018 22:02    Microbiology: Recent Results (from the past 240 hour(s))  Blood culture (routine x 2)     Status: None (Preliminary result)   Collection Time: 02/06/18  9:27 PM  Result Value Ref Range Status   Specimen Description LEFT ANTECUBITAL  Final   Special Requests   Final    BOTTLES DRAWN AEROBIC AND ANAEROBIC Blood Culture  adequate volume   Culture   Final    NO GROWTH 2 DAYS Performed at [facility], [address], [place], [place] [address]    Report Status PENDING  Incomplete  Blood culture (routine x 2)     Status: None (Preliminary result)   Collection Time: 02/06/18  9:29 PM  Result Value Ref Range Status   Specimen Description BLOOD RIGHT FOREARM  Final   Special Requests   Final    BOTTLES DRAWN AEROBIC AND ANAEROBIC Blood Culture adequate volume   Culture   Final    NO GROWTH 2 DAYS Performed at [facility], [address], [place], [place] [address]    Report Status PENDING  Incomplete     Labs: Basic Metabolic Panel: Recent Labs  Lab 02/06/18 2128 02/08/18 0522  NA 132* 138  K 3.2* 4.4  CL 101 108  CO2 21* 23  GLUCOSE 100* 127*  BUN 16 10  CREATININE 1.11 0.66  CALCIUM 8.2* 8.2*   Liver Function Tests: Recent Labs  Lab 02/06/18 2128  AST 26  ALT 21  ALKPHOS 49  BILITOT 1.0  PROT 6.7  ALBUMIN 2.9*   CBC: Recent Labs  Lab 02/06/18 2128 02/08/18 0522  WBC 6.8 4.4  HGB 13.1 11.0*  HCT 38.7* 32.7*  MCV 91.9 93.2  PLT 169 152    Signed:  Vassie Loll MD.  [facility] 02/08/2018, 4:14 PM

## 2018-02-08 NOTE — Care Management Important Message (Signed)
Important Message  Patient Details  Name: Patrick Archer MRN: [id number] Date of Birth: 1955/06/26   Medicare Important Message Given:  Yes    Renie Ora 02/08/2018, 11:20 AM

## 2018-02-08 NOTE — Progress Notes (Signed)
IV removed and DC instructions reviewed with scripts given for meds.  Wife to drive home

## 2018-02-11 LAB — RNA QUALITATIVE: HIV 1 RNA Qualitative: 1

## 2018-02-11 LAB — HIV ANTIBODY (ROUTINE TESTING W REFLEX): HIV SCREEN 4TH GENERATION: REACTIVE — AB

## 2018-02-11 LAB — CULTURE, BLOOD (ROUTINE X 2)
Culture: NO GROWTH
Culture: NO GROWTH
Special Requests: ADEQUATE
Special Requests: ADEQUATE

## 2018-02-11 LAB — HIV 1/2 AB DIFFERENTIATION
HIV 1 Ab: NEGATIVE
HIV 2 Ab: NEGATIVE
Note: NEGATIVE

## 2020-01-16 IMAGING — DX DG CHEST 2V
2 series · 2 of 2 positions shown · non-contrast
Comparison: 02/13/2018

CLINICAL DATA: Fever of 102.5 and sinus pressure since [REDACTED].

EXAM:
CHEST - 2 VIEW

[chest lat]
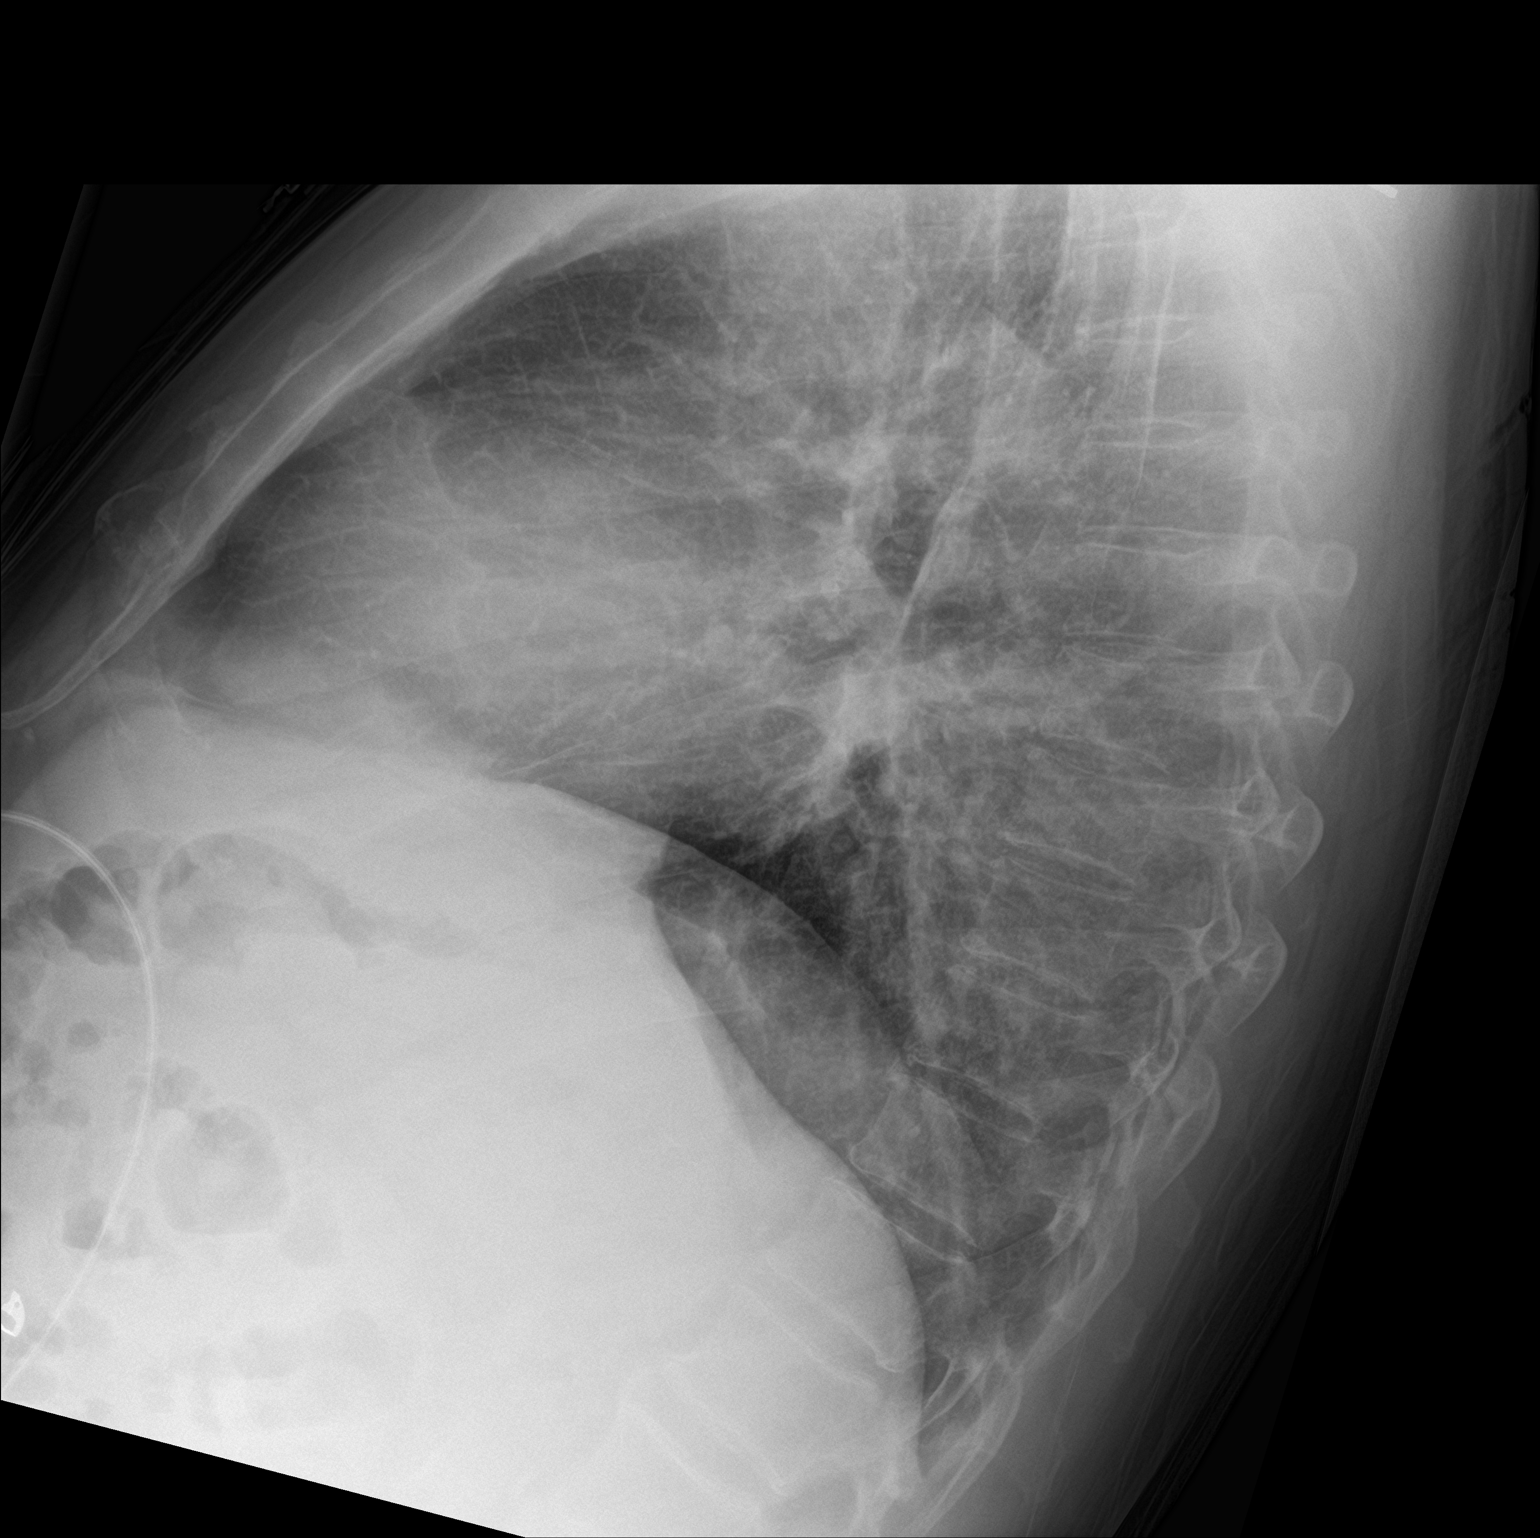

[chest ap]
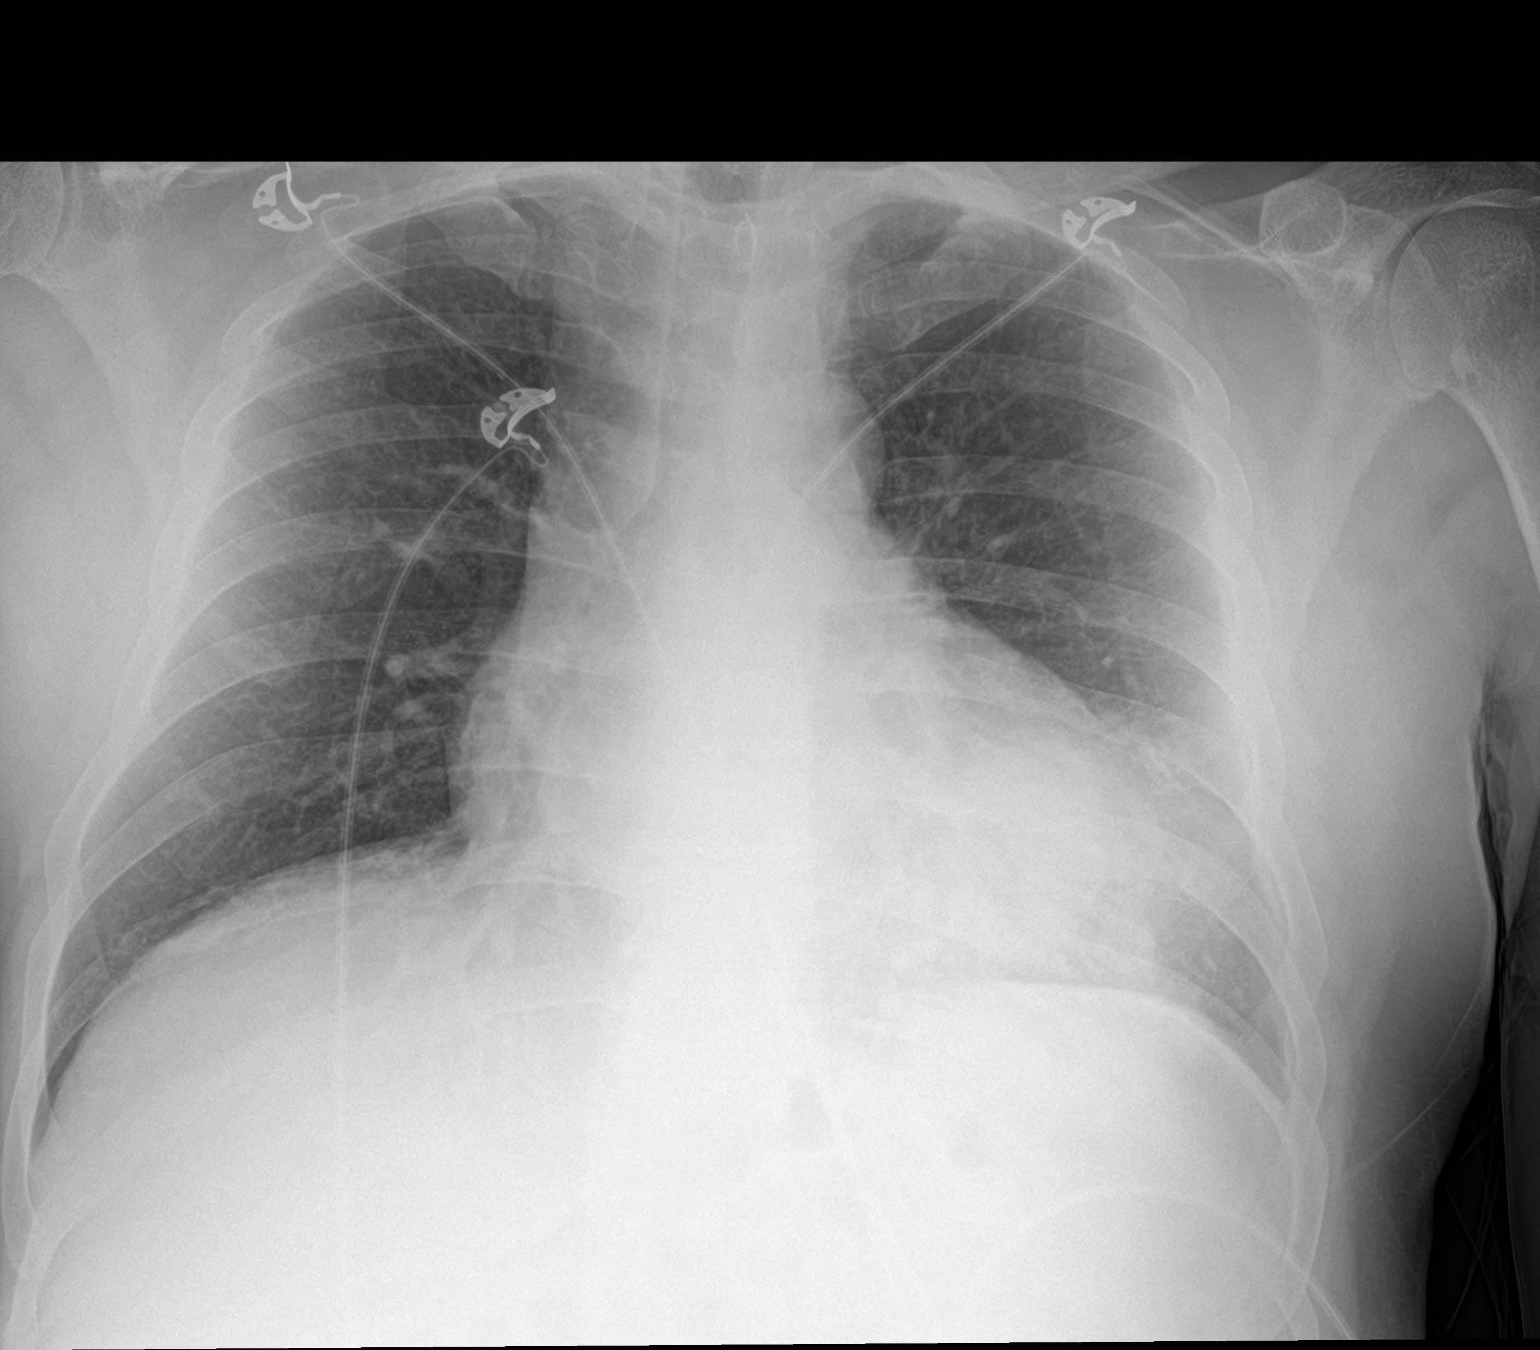

[2 of 2 positions shown; findings below may reference images not displayed]

FINDINGS: AP upright portable view demonstrates stable cardiomegaly.
Nonaneurysmal thoracic aorta. Subtle increase in retrocardiac
opacity raises concern for an evolving pneumonia at the left lung
base. This is best seen on the frontal projection.
IMPRESSION: Slightly more prominent retrocardiac opacity raising concern for an
evolving pneumonia at the left lung base.

## 2022-01-16 ENCOUNTER — Other Ambulatory Visit (HOSPITAL_COMMUNITY): Payer: Self-pay

## 2024-02-26 ENCOUNTER — Encounter: Payer: Self-pay | Admitting: Internal Medicine

## 2024-04-12 ENCOUNTER — Ambulatory Visit: Admitting: Internal Medicine

## 2024-06-03 ENCOUNTER — Encounter: Payer: Self-pay | Admitting: Internal Medicine

## 2024-07-04 ENCOUNTER — Ambulatory Visit: Admitting: Internal Medicine
